# Patient Record
Sex: Female | Born: 1955 | Race: White | Hispanic: No | Smoking: Never smoker
Health system: Southern US, Community
[De-identification: ages and names within clinical notes are randomized; demographics above are authoritative.]

## PROBLEM LIST (undated history)

## (undated) DIAGNOSIS — M5030 Other cervical disc degeneration, unspecified cervical region: Secondary | ICD-10-CM

## (undated) HISTORY — PX: TUBAL LIGATION: SHX77

## (undated) HISTORY — PX: LUMBAR SPINE SURGERY: SHX701

## (undated) HISTORY — DX: Other cervical disc degeneration, unspecified cervical region: M50.30

---

## 2012-10-31 HISTORY — PX: COLONOSCOPY: SHX174

## 2013-07-25 ENCOUNTER — Encounter: Payer: Self-pay | Admitting: Internal Medicine

## 2017-12-05 ENCOUNTER — Encounter: Payer: Self-pay | Admitting: Internal Medicine

## 2017-12-18 DIAGNOSIS — M5416 Radiculopathy, lumbar region: Secondary | ICD-10-CM | POA: Insufficient documentation

## 2018-01-24 ENCOUNTER — Encounter: Payer: Self-pay | Admitting: Nurse Practitioner

## 2018-01-24 ENCOUNTER — Ambulatory Visit: Payer: Medicare HMO | Admitting: Nurse Practitioner

## 2018-01-24 DIAGNOSIS — R197 Diarrhea, unspecified: Secondary | ICD-10-CM

## 2018-01-24 DIAGNOSIS — K559 Vascular disorder of intestine, unspecified: Secondary | ICD-10-CM

## 2018-01-24 DIAGNOSIS — K649 Unspecified hemorrhoids: Secondary | ICD-10-CM

## 2018-01-24 NOTE — Patient Instructions (Signed)
1. Use Preparation H as needed for your hemorrhoid symptoms.  If this does not work well enough for you, call our office and we can send in a prescription. 2. Avoid dairy products.  As we discussed, there are multiple dairy substitute available.  This includes soy milk, almond milk, and coconut based products.  There are even coconut, soy, almond based ice cream and dessert products. 3. Avoid all NSAIDs (ibuprofen, Motrin, Advil, Aleve, naproxen, Naprosyn, anything with "NSAID" on the bottle). 4. Call us if you have any worsening abdominal pain or sudden onset of bleeding. 5. Follow-up in 2 months. 6. Call us if you have any questions or concerns.   It was good to meet you today. Enjoy the sunshine!!!     At [facility] we value your feedback. You may receive a survey about your visit today. Please share your experience as we strive to create trusing relationships with our patients to provide genuine, compassionate, quality care.

## 2018-01-24 NOTE — Progress Notes (Signed)
Primary Care Physician:  Ignatius Specking, MD Primary Gastroenterologist:  Dr. Jena Gauss  Chief Complaint  Patient presents with  . Hemorrhoids    bleeds when has diarrhea or uses wipes  . Diarrhea    occurs with dairy products    HPI:   Rhonda Cox is a 62 y.o. female who presents on referral from primary care for hemorrhoids.  Documentation included with referral was reviewed including office note dated 11/27/2017.  In the assessment and plan noted GERD started on Protonix 40 mg daily.  No mention of hemorrhoids in the office note.  No history of colonoscopy in our system.  Today she states she previously saw Dr. Teena Dunk previously (before he left) and was diagnoses with ischemic colitis based on colonoscopy 2014 with biopsies (patient has provided records). Previously recommended to avoid all NSAIDs. Recommended repeat colonoscopy in 10 years. Currently having an issue with a hemorrhoid. This causes bleeding with diarrhea or wipes (with chemical moisturizer). Has bleeding about once a month and lasts for about one episode. Has obtained preparation H to try with her next episode. Has diarrhea with any dairy products, which she tries to avoid. Currently no abdominal pain, but does intermittently have lower abdominal cramping, intermittent. Denies unintentional weight loss, worsening abdominal pain after eating. Denies melena, N/V, fever, chills. Denies chest pain, dyspnea, dizziness, lightheadedness, syncope, near syncope. Denies any other upper or lower GI symptoms.  HISTORY: Patient provided previous ([facility], [place]) GI records. These were reviewed and marked for scanning. Last TCS dated 07/25/2013 which found ischemic colitis involving the descending colon and sigmoid colon status post biopsy.  Retroflexed views showed rectum without abnormality.  Surgical pathology found the biopsies to be acute colitis consistent with ischemic colitis.  Recommended antibiotic treatment, avoid NSAIDs and  aspirin.  Most recent GI note dated 03/26/2015 for blood in stool.  Denied NSAID use at that time.  Rectal bleeding deemed likely due to hemorrhoids.  Use Preparation H as needed.  Past Medical History:  Diagnosis Date  . DDD (degenerative disc disease), cervical     Past Surgical History:  Procedure Laterality Date  . LUMBAR SPINE SURGERY     x 6  . TUBAL LIGATION      Current Outpatient Medications  Medication Sig Dispense Refill  . acetaminophen (TYLENOL) 500 MG tablet Take 500 mg by mouth as needed.    Marland Kitchen lisinopril (PRINIVIL,ZESTRIL) 2.5 MG tablet Take 1 tablet by mouth daily. Takes with 5mg  tablet to equal 7.5mg     . lisinopril (PRINIVIL,ZESTRIL) 5 MG tablet Take 1 tablet by mouth daily. Takes with 2.5mg  tablet to equal 7.5mg     . pantoprazole (PROTONIX) 40 MG tablet Take 1 tablet by mouth daily.    . traMADol (ULTRAM) 50 MG tablet Take 50 mg by mouth as needed.     No current facility-administered medications for this visit.     Allergies as of 01/24/2018 - Review Complete 01/24/2018  Allergen Reaction Noted  . Ciprofloxacin Nausea And Vomiting 01/24/2018  . Hydrocodone Diarrhea 01/24/2018  . Nsaids  01/24/2018    Family History  Problem Relation Age of Onset  . Colon cancer Neg Hx     Social History   Socioeconomic History  . Marital status: Widowed    Spouse name: Not on file  . Number of children: Not on file  . Years of education: Not on file  . Highest education level: Not on file  Occupational History  . Not on file  Social  Needs  . Financial resource strain: Not on file  . Food insecurity:    Worry: Not on file    Inability: Not on file  . Transportation needs:    Medical: Not on file    Non-medical: Not on file  Tobacco Use  . Smoking status: Never Smoker  . Smokeless tobacco: Never Used  Substance and Sexual Activity  . Alcohol use: Never    Frequency: Never  . Drug use: Never  . Sexual activity: Not on file  Lifestyle  . Physical  activity:    Days per week: Not on file    Minutes per session: Not on file  . Stress: Not on file  Relationships  . Social connections:    Talks on phone: Not on file    Gets together: Not on file    Attends religious service: Not on file    Active member of club or organization: Not on file    Attends meetings of clubs or organizations: Not on file    Relationship status: Not on file  . Intimate partner violence:    Fear of current or ex partner: Not on file    Emotionally abused: Not on file    Physically abused: Not on file    Forced sexual activity: Not on file  Other Topics Concern  . Not on file  Social History Narrative  . Not on file    Review of Systems: General: Negative for anorexia, weight loss, fever, chills, fatigue, weakness. ENT: Negative for hoarseness, difficulty swallowing. CV: Negative for chest pain, angina, palpitations, peripheral edema.  Respiratory: Negative for dyspnea at rest, cough, sputum, wheezing.  GI: See history of present illness. MS: Negative for joint pain, low back pain.  Derm: Negative for rash or itching.  Endo: Negative for unusual weight change.  Heme: Negative for bruising or bleeding. Allergy: Negative for rash or hives.    Physical Exam: BP (!) 154/85   Pulse 76   Temp 98 F (36.7 C) (Oral)   Ht 5\' 6"  (1.676 m)   Wt 143 lb 12.8 oz (65.2 kg)   BMI 23.21 kg/m  General:   Alert and oriented. Pleasant and cooperative. Well-nourished and well-developed.  Head:  Normocephalic and atraumatic. Eyes:  Without icterus, sclera clear and conjunctiva pink.  Ears:  Normal auditory acuity. Cardiovascular:  S1, S2 present without murmurs appreciated. Extremities without clubbing or edema. Respiratory:  Clear to auscultation bilaterally. No wheezes, rales, or rhonchi. No distress.  Gastrointestinal:  +BS, soft, non-tender and non-distended. No HSM noted. No guarding or rebound. No masses appreciated.  Rectal:  Deferred   Musculoskalatal:  Symmetrical without gross deformities. Neurologic:  Alert and oriented x4;  grossly normal neurologically. Psych:  Alert and cooperative. Normal mood and affect. Heme/Lymph/Immune: No excessive bruising noted.    01/24/2018 3:37 PM   Disclaimer: This note was dictated with voice recognition software. Similar sounding words can inadvertently be transcribed and may not be corrected upon review.

## 2018-01-25 ENCOUNTER — Encounter: Payer: Self-pay | Admitting: Internal Medicine

## 2018-01-26 DIAGNOSIS — R197 Diarrhea, unspecified: Secondary | ICD-10-CM | POA: Insufficient documentation

## 2018-01-26 DIAGNOSIS — K649 Unspecified hemorrhoids: Secondary | ICD-10-CM | POA: Insufficient documentation

## 2018-01-26 DIAGNOSIS — K559 Vascular disorder of intestine, unspecified: Secondary | ICD-10-CM | POA: Insufficient documentation

## 2018-01-26 NOTE — Assessment & Plan Note (Signed)
Chronic history of hemorrhoids which causes scant toilet tissue hematochezia typically when she has significant diarrhea or if she uses wet wipes.  Only has one episode, occurs about once a months.  Preparation H was obtained per previous recommendations but she has not tried it yet.  Has diarrhea with any dairy products which she tries to avoid.  No other red flag/warning signs or symptoms.  Recommend she try Preparation H and see if this helps.  Call us if it does not we can try Anusol rectal cream.  Avoid dairy and NSAIDs.  Follow-up in 2 months.  At that time we can decide if we need to repeat colonoscopy based on symptom progression.

## 2018-01-26 NOTE — Assessment & Plan Note (Signed)
Intermittent diarrhea noted with specific food triggers.  Specifically, dairy causes diarrhea.  She tries to avoid this but occasionally does have some dairy.  I discussed the excellent availability of nondairy products that are dairy like.  I have recommended she try these including soy, almond, coconut milk-based products.  Avoid dairy as much as possible.  Follow-up in 2 months.

## 2018-01-26 NOTE — Assessment & Plan Note (Signed)
She was previously diagnosed about 5 years ago with ischemic colitis with an acute GI bleed.  Disease was limited to the descending colon and sigmoid colon.  Previous colonoscopy results scanned.  She was recommended to avoid NSAIDs.  She was treated with antibiotics and her symptoms resolved.  No further acute bleeding.  She does have mild scant toilet tissue hematochezia with excessive diarrhea due to known food triggers.  This occurs about once a month and is about one episode per occurrence.  I recommended dairy avoidance and Preparation H as per above.  Call if Anusol needed.  Avoid NSAIDs.  Follow-up in 2 months we can consider need to repeat colonoscopy.

## 2018-01-29 NOTE — Progress Notes (Signed)
CC'D TO PCP °

## 2018-03-30 ENCOUNTER — Ambulatory Visit: Payer: Medicare HMO | Admitting: Nurse Practitioner

## 2018-03-30 ENCOUNTER — Encounter: Payer: Self-pay | Admitting: Nurse Practitioner

## 2018-03-30 VITALS — BP 189/70 | HR 70 | Temp 97.8°F | Ht 66.0 in | Wt 144.6 lb

## 2018-03-30 DIAGNOSIS — R197 Diarrhea, unspecified: Secondary | ICD-10-CM | POA: Diagnosis not present

## 2018-03-30 DIAGNOSIS — K559 Vascular disorder of intestine, unspecified: Secondary | ICD-10-CM

## 2018-03-30 DIAGNOSIS — K649 Unspecified hemorrhoids: Secondary | ICD-10-CM

## 2018-03-30 NOTE — Assessment & Plan Note (Signed)
Hemorrhoid symptoms improved on Preparation H.  No further rectal bleeding.  Continue Preparation H.  She can call us if this loses effectiveness and we can send in Anusol rectal cream to her pharmacy.  Follow-up in 6 months.

## 2018-03-30 NOTE — Assessment & Plan Note (Signed)
The patient has on and off diarrhea.  This typically triggered with dairy products and occurs with abdominal cramping, gas, bloating.  She has been trying to avoid dairy.  She has purchased Lactaid pills but she has not tried them yet.  We discussed multiple nondairy options to substitute for typical dairy products.  Use Lactaid if unable to avoid dairy.  She verbalized understanding.  She can call our office with any questions.  Follow-up in 6 months.

## 2018-03-30 NOTE — Patient Instructions (Addendum)
1. As we discussed, continue to avoid dairy. 2. There are a large number of dairy alternatives available. 3. If you need to eat dairy, take Lactaid before doing so. 4. Continue to use Preparation H. 5. If you need something stronger for your hemorrhoids, call us and we can send in a steroid cream to help. 6. Return for follow-up in 6 months. 7. Call us if you have any questions or concerns.  At [facility] we value your feedback. You may receive a survey about your visit today. Please share your experience as we strive to create trusting relationships with our patients to provide genuine, compassionate, quality care.  It was great to see you today!  Have a wonderful summer, enjoy the cooler weather!!

## 2018-03-30 NOTE — Progress Notes (Signed)
cc'd to pcp 

## 2018-03-30 NOTE — Progress Notes (Signed)
Referring Provider: Ignatius SpeckingVyas, Dhruv B, MD Primary Care Physician:  Ignatius SpeckingVyas, Dhruv B, MD Primary GI:  Dr. Jena Gaussourk  Chief Complaint  Patient presents with  . Diarrhea    "on and off"    HPI:   Rhonda Cox is a 62 y.o. female who presents for follow-up on hemorrhoids and diarrhea. The patient was last seen in our office 01/24/2018 for the same as well as ischemic colitis.  3 of GERD on Protonix 40 mg daily.  No history of colonoscopy in our system.  Patient previously saw Dr. Teena DunkBenson and was diagnosed with ischemic colitis based on colonoscopy in 2014 with biopsies.  Previous recommendation to avoid all NSAIDs, repeat colonoscopy in 10 years.  Noted bleeding with hemorrhoids when she has diarrhea or wipes, typically about once a month and last for only one episode of the time.  Plans to try Preparation H.  Diarrhea with any dairy products which she tries to avoid.  No abdominal pain at that time, but does note intermittent lower abdominal cramping.  No other GI symptoms.  Recommended use Preparation H, call if Anusol needed, avoid all NSAIDs, avoid dairy products to see if this helps her diarrhea.  Follow-up in 2 months to consider possible need for colonoscopy.  Reviewed previous colon[place] dated 07/[place] which found ischemic colitis involving the descending colon and sigmoid colon status post biopsy.  Rectum without abnormality.  Surgical pathology found the biopsies to be acute colitis consistent with ischemic colitis.  Recommended antibiotics, avoid NSAIDs and aspirin.  Today she states her hemorrhoids are improved on Preparation H. Has "on and off" diarrhea, but "more on then off." The other day she had to use Pepto Bismol, which helped. Has not tried Imodium. Diarrhea is "all due to dairy products." Has tried now A2 Milk which is compatible with lactose intolerance which has helped. Has Lactaid pills, hasn't tried them. Is trying to avoid all dairy. Discussed substitution  options. Has had cramping, bloating, gas when she has diarrhea. When she is not having dairy-related diarrhea she does not have abdominal pain. Denies hematochezia, melena, fever, chills, unintentional weight loss. Denies chest pain, dyspnea, dizziness, lightheadedness, syncope, near syncope. Denies any other upper or lower GI symptoms.  Past Medical History:  Diagnosis Date  . DDD (degenerative disc disease), cervical     Past Surgical History:  Procedure Laterality Date  . LUMBAR SPINE SURGERY     x 6  . TUBAL LIGATION      Current Outpatient Medications  Medication Sig Dispense Refill  . acetaminophen (TYLENOL) 500 MG tablet Take 500 mg by mouth as needed.    Marland Kitchen. lisinopril (PRINIVIL,ZESTRIL) 2.5 MG tablet Take 1 tablet by mouth daily. Takes with 5mg  tablet to equal 7.5mg     . lisinopril (PRINIVIL,ZESTRIL) 5 MG tablet Take 1 tablet by mouth daily. Takes with 2.5mg  tablet to equal 7.5mg     . pantoprazole (PROTONIX) 40 MG tablet Take 1 tablet by mouth as needed.     . traMADol (ULTRAM) 50 MG tablet Take 50 mg by mouth as needed.     No current facility-administered medications for this visit.     Allergies as of 03/30/2018 - Review Complete 03/30/2018  Allergen Reaction Noted  . Ciprofloxacin Nausea And Vomiting 01/24/2018  . Hydrocodone Diarrhea 01/24/2018  . Nsaids  01/24/2018    Family History  Problem Relation Age of Onset  . Colon cancer Neg Hx     Social History   Socioeconomic  History  . Marital status: Widowed    Spouse name: Not on file  . Number of children: Not on file  . Years of education: Not on file  . Highest education level: Not on file  Occupational History  . Not on file  Social Needs  . Financial resource strain: Not on file  . Food insecurity:    Worry: Not on file    Inability: Not on file  . Transportation needs:    Medical: Not on file    Non-medical: Not on file  Tobacco Use  . Smoking status: Never Smoker  . Smokeless tobacco: Never  Used  Substance and Sexual Activity  . Alcohol use: Never    Frequency: Never  . Drug use: Never  . Sexual activity: Not on file  Lifestyle  . Physical activity:    Days per week: Not on file    Minutes per session: Not on file  . Stress: Not on file  Relationships  . Social connections:    Talks on phone: Not on file    Gets together: Not on file    Attends religious service: Not on file    Active member of club or organization: Not on file    Attends meetings of clubs or organizations: Not on file    Relationship status: Not on file  Other Topics Concern  . Not on file  Social History Narrative  . Not on file    Review of Systems: General: Negative for anorexia, weight loss, fever, chills, fatigue, weakness. ENT: Negative for hoarseness, difficulty swallowing. CV: Negative for chest pain, angina, palpitations, peripheral edema.  Respiratory: Negative for dyspnea at rest, cough, sputum, wheezing.  GI: See history of present illness. Endo: Negative for unusual weight change.  Heme: Negative for bruising or bleeding.   Physical Exam: BP (!) 189/70   Pulse 70   Temp 97.8 F (36.6 C) (Oral)   Ht 5\' 6"  (1.676 m)   Wt 144 lb 9.6 oz (65.6 kg)   BMI 23.34 kg/m  General:   Alert and oriented. Pleasant and cooperative. Well-nourished and well-developed.  Eyes:  Without icterus, sclera clear and conjunctiva pink.  Ears:  Normal auditory acuity. Cardiovascular:  S1, S2 present without murmurs appreciated. Extremities without clubbing or edema. Respiratory:  Clear to auscultation bilaterally. No wheezes, rales, or rhonchi. No distress.  Gastrointestinal:  +BS, soft, non-tender and non-distended. No HSM noted. No guarding or rebound. No masses appreciated.  Rectal:  Deferred  Musculoskalatal:  Symmetrical without gross deformities. Neurologic:  Alert and oriented x4;  grossly normal neurologically. Psych:  Alert and cooperative. Normal mood and affect.    03/30/2018 10:53  AM   Disclaimer: This note was dictated with voice recognition software. Similar sounding words can inadvertently be transcribed and may not be corrected upon review.

## 2018-03-30 NOTE — Assessment & Plan Note (Addendum)
Previously diagnosed with ischemic colitis based on CT pathology.  She is avoiding NSAIDs.  No weight loss, worsening diarrhea, worsening abdominal pain.  Her abdominal pain and diarrhea only occur with lactose consumption, as per below.  I have emphasized to her to call us and notify us of any worsening abdominal pain, diarrhea, weight loss.  She verbalized understanding.  Follow-up in 6 months.  At this point, I do not feel there is a need for urgent repeat of her colonoscopy previously completed in 2014.  She indicates she is wanting to avoid colonoscopy if possible.

## 2018-04-10 DIAGNOSIS — Z1339 Encounter for screening examination for other mental health and behavioral disorders: Secondary | ICD-10-CM | POA: Diagnosis not present

## 2018-04-10 DIAGNOSIS — I10 Essential (primary) hypertension: Secondary | ICD-10-CM | POA: Diagnosis not present

## 2018-04-10 DIAGNOSIS — Z6823 Body mass index (BMI) 23.0-23.9, adult: Secondary | ICD-10-CM | POA: Diagnosis not present

## 2018-04-10 DIAGNOSIS — R5383 Other fatigue: Secondary | ICD-10-CM | POA: Diagnosis not present

## 2018-04-10 DIAGNOSIS — Z1211 Encounter for screening for malignant neoplasm of colon: Secondary | ICD-10-CM | POA: Diagnosis not present

## 2018-04-10 DIAGNOSIS — Z7189 Other specified counseling: Secondary | ICD-10-CM | POA: Diagnosis not present

## 2018-04-10 DIAGNOSIS — E7800 Pure hypercholesterolemia, unspecified: Secondary | ICD-10-CM | POA: Diagnosis not present

## 2018-04-10 DIAGNOSIS — Z299 Encounter for prophylactic measures, unspecified: Secondary | ICD-10-CM | POA: Diagnosis not present

## 2018-04-10 DIAGNOSIS — Z1331 Encounter for screening for depression: Secondary | ICD-10-CM | POA: Diagnosis not present

## 2018-04-10 DIAGNOSIS — Z0000 Encounter for general adult medical examination without abnormal findings: Secondary | ICD-10-CM | POA: Diagnosis not present

## 2018-04-10 DIAGNOSIS — Z79899 Other long term (current) drug therapy: Secondary | ICD-10-CM | POA: Diagnosis not present

## 2018-05-08 DIAGNOSIS — M8580 Other specified disorders of bone density and structure, unspecified site: Secondary | ICD-10-CM | POA: Diagnosis not present

## 2018-05-11 DIAGNOSIS — Z6823 Body mass index (BMI) 23.0-23.9, adult: Secondary | ICD-10-CM | POA: Diagnosis not present

## 2018-05-11 DIAGNOSIS — M47816 Spondylosis without myelopathy or radiculopathy, lumbar region: Secondary | ICD-10-CM | POA: Diagnosis not present

## 2018-06-08 DIAGNOSIS — R58 Hemorrhage, not elsewhere classified: Secondary | ICD-10-CM | POA: Diagnosis not present

## 2018-06-08 DIAGNOSIS — Z6823 Body mass index (BMI) 23.0-23.9, adult: Secondary | ICD-10-CM | POA: Diagnosis not present

## 2018-06-08 DIAGNOSIS — I10 Essential (primary) hypertension: Secondary | ICD-10-CM | POA: Diagnosis not present

## 2018-06-08 DIAGNOSIS — Z299 Encounter for prophylactic measures, unspecified: Secondary | ICD-10-CM | POA: Diagnosis not present

## 2018-06-11 DIAGNOSIS — Z6823 Body mass index (BMI) 23.0-23.9, adult: Secondary | ICD-10-CM | POA: Diagnosis not present

## 2018-06-11 DIAGNOSIS — Z01419 Encounter for gynecological examination (general) (routine) without abnormal findings: Secondary | ICD-10-CM | POA: Diagnosis not present

## 2018-06-21 DIAGNOSIS — M47816 Spondylosis without myelopathy or radiculopathy, lumbar region: Secondary | ICD-10-CM | POA: Diagnosis not present

## 2018-06-21 DIAGNOSIS — Z6823 Body mass index (BMI) 23.0-23.9, adult: Secondary | ICD-10-CM | POA: Diagnosis not present

## 2018-06-21 DIAGNOSIS — M4317 Spondylolisthesis, lumbosacral region: Secondary | ICD-10-CM | POA: Diagnosis not present

## 2018-08-09 DIAGNOSIS — M47816 Spondylosis without myelopathy or radiculopathy, lumbar region: Secondary | ICD-10-CM | POA: Diagnosis not present

## 2018-08-09 DIAGNOSIS — Z01818 Encounter for other preprocedural examination: Secondary | ICD-10-CM | POA: Diagnosis not present

## 2018-08-09 DIAGNOSIS — I10 Essential (primary) hypertension: Secondary | ICD-10-CM | POA: Diagnosis not present

## 2018-08-09 DIAGNOSIS — Z79899 Other long term (current) drug therapy: Secondary | ICD-10-CM | POA: Diagnosis not present

## 2018-08-13 DIAGNOSIS — M4326 Fusion of spine, lumbar region: Secondary | ICD-10-CM | POA: Diagnosis not present

## 2018-08-13 DIAGNOSIS — M5387 Other specified dorsopathies, lumbosacral region: Secondary | ICD-10-CM | POA: Diagnosis not present

## 2018-08-13 DIAGNOSIS — M4726 Other spondylosis with radiculopathy, lumbar region: Secondary | ICD-10-CM | POA: Diagnosis not present

## 2018-08-13 DIAGNOSIS — Z79899 Other long term (current) drug therapy: Secondary | ICD-10-CM | POA: Diagnosis not present

## 2018-08-13 DIAGNOSIS — I10 Essential (primary) hypertension: Secondary | ICD-10-CM | POA: Diagnosis not present

## 2018-08-13 DIAGNOSIS — M4727 Other spondylosis with radiculopathy, lumbosacral region: Secondary | ICD-10-CM | POA: Diagnosis not present

## 2018-08-13 DIAGNOSIS — M5186 Other intervertebral disc disorders, lumbar region: Secondary | ICD-10-CM | POA: Diagnosis not present

## 2018-08-13 DIAGNOSIS — M47817 Spondylosis without myelopathy or radiculopathy, lumbosacral region: Secondary | ICD-10-CM | POA: Diagnosis not present

## 2018-08-13 DIAGNOSIS — Z981 Arthrodesis status: Secondary | ICD-10-CM | POA: Diagnosis not present

## 2018-08-21 DIAGNOSIS — M47816 Spondylosis without myelopathy or radiculopathy, lumbar region: Secondary | ICD-10-CM | POA: Diagnosis not present

## 2018-08-21 DIAGNOSIS — Z981 Arthrodesis status: Secondary | ICD-10-CM | POA: Diagnosis not present

## 2018-08-21 DIAGNOSIS — M4317 Spondylolisthesis, lumbosacral region: Secondary | ICD-10-CM | POA: Diagnosis not present

## 2018-08-21 DIAGNOSIS — M4327 Fusion of spine, lumbosacral region: Secondary | ICD-10-CM | POA: Diagnosis not present

## 2018-08-23 DIAGNOSIS — E7800 Pure hypercholesterolemia, unspecified: Secondary | ICD-10-CM | POA: Diagnosis not present

## 2018-08-23 DIAGNOSIS — Z299 Encounter for prophylactic measures, unspecified: Secondary | ICD-10-CM | POA: Diagnosis not present

## 2018-08-23 DIAGNOSIS — M545 Low back pain: Secondary | ICD-10-CM | POA: Diagnosis not present

## 2018-08-23 DIAGNOSIS — Z6823 Body mass index (BMI) 23.0-23.9, adult: Secondary | ICD-10-CM | POA: Diagnosis not present

## 2018-08-23 DIAGNOSIS — I10 Essential (primary) hypertension: Secondary | ICD-10-CM | POA: Diagnosis not present

## 2018-08-23 DIAGNOSIS — Z09 Encounter for follow-up examination after completed treatment for conditions other than malignant neoplasm: Secondary | ICD-10-CM | POA: Diagnosis not present

## 2018-10-03 ENCOUNTER — Ambulatory Visit: Payer: Medicare HMO | Admitting: Nurse Practitioner

## 2018-10-03 ENCOUNTER — Encounter: Payer: Self-pay | Admitting: Nurse Practitioner

## 2018-10-03 VITALS — BP 152/78 | HR 73 | Temp 97.4°F | Ht 66.0 in | Wt 144.0 lb

## 2018-10-03 DIAGNOSIS — K219 Gastro-esophageal reflux disease without esophagitis: Secondary | ICD-10-CM

## 2018-10-03 DIAGNOSIS — R197 Diarrhea, unspecified: Secondary | ICD-10-CM

## 2018-10-03 DIAGNOSIS — K559 Vascular disorder of intestine, unspecified: Secondary | ICD-10-CM | POA: Diagnosis not present

## 2018-10-03 NOTE — Assessment & Plan Note (Signed)
Diarrhea with likely lactose intolerance.  She has not had any diarrhea since eliminating dairy from her diet completely.  She is found tolerable substitutes that she enjoys including lactose-free ice cream, lactose-free milk substitute.  Recommend she continue with lifestyle changes and dairy elimination.  Follow-up in 1 year.  Follow-up sooner for any new problems or worsening symptoms.

## 2018-10-03 NOTE — Assessment & Plan Note (Signed)
Diagnosed with chronic ischemic colitis on colonoscopy in 2014 by Dr. Teena DunkBenson.  She has not had any symptoms since we have been seeing her.  Continue to monitor for symptoms or acute changes.  Otherwise, follow-up in 1 year.

## 2018-10-03 NOTE — Progress Notes (Signed)
Referring Provider: Ignatius SpeckingVyas, Dhruv B, MD Primary Care Physician:  Ignatius SpeckingVyas, Dhruv B, MD Primary GI:  Dr. Jena Gaussourk  Chief Complaint  Patient presents with  . colitis    doing ok    HPI:   Rhonda Cox is a 62 y.o. female who presents for follow-up on colitis, diarrhea, hemorrhoids.  The patient was last seen in our office 03/30/2018 for hemorrhoids, diarrhea, ischemic colitis.  Noted history of GERD on Protonix.  Diagnosed with ischemic colitis on colonoscopy in 2014 with Dr. Teena DunkBenson based on biopsies.  Recommended avoid all NSAIDs, repeat colonoscopy in 10 years.  Chronic diarrhea with hemorrhoids and rectal bleeding about once a month.  Avoids dairy as a trigger.  Intermittent lower abdominal cramping pain.  At her last visit her hemorrhoids were improved with Preparation H and has "on and off" diarrhea but "more on than off."  Pepto-Bismol has helped, had not tried Imodium and noted diarrhea is "all due to dairy products."  Lactose-free milk substitute is helped, has Lactaid pills but has not tried them.  With her diarrhea triggered by dairy she does have bloating, cramping, gas.  No abdominal pain unless she consumes dairy.  Overall felt abdominal symptoms likely due to lactose intolerance.  Recommended continue avoidance of dairy, continue Preparation H as needed, follow-up in 6 months.  Today she states she's doing well overall. GERD is doing well on PPI, well controlled with trigger avoidance. Diarrhea is doing great with dairy avoidance. She drinks A2 milk that is lactose free. If she eats ice cream, she eats [place] lactose free ice cream. She had back surgery recently and during her pre- and post-surgery and was treated with Bactrim; this caused significant N/V and it has been added to her allergy list. Denies abdominal pain, N/V, fever, chills, unintentional weight loss.   Past Medical History:  Diagnosis Date  . DDD (degenerative disc disease), cervical     Past Surgical History:  Procedure  Laterality Date  . LUMBAR SPINE SURGERY     x 6  . TUBAL LIGATION      Current Outpatient Medications  Medication Sig Dispense Refill  . acetaminophen (TYLENOL) 500 MG tablet Take 500 mg by mouth as needed.    . Calcium Carb-Cholecalciferol (CALCIUM 1000 + D) 1000-800 MG-UNIT TABS Take 1 tablet by mouth daily.     Marland Kitchen. lisinopril (PRINIVIL,ZESTRIL) 10 MG tablet Take 10 mg by mouth daily.  4  . pantoprazole (PROTONIX) 40 MG tablet Take 1 tablet by mouth as needed.     . traMADol (ULTRAM) 50 MG tablet Take 50 mg by mouth as needed.     No current facility-administered medications for this visit.     Allergies as of 10/03/2018 - Review Complete 10/03/2018  Allergen Reaction Noted  . Bactrim [sulfamethoxazole-trimethoprim] Nausea And Vomiting 10/03/2018  . Ciprofloxacin Nausea And Vomiting 01/24/2018  . Hydrocodone Diarrhea 01/24/2018  . Nsaids  01/24/2018    Family History  Problem Relation Age of Onset  . Colon cancer Neg Hx     Social History   Socioeconomic History  . Marital status: Widowed    Spouse name: Not on file  . Number of children: Not on file  . Years of education: Not on file  . Highest education level: Not on file  Occupational History  . Not on file  Social Needs  . Financial resource strain: Not on file  . Food insecurity:    Worry: Not on file    Inability: Not  on file  . Transportation needs:    Medical: Not on file    Non-medical: Not on file  Tobacco Use  . Smoking status: Never Smoker  . Smokeless tobacco: Never Used  Substance and Sexual Activity  . Alcohol use: Never    Frequency: Never  . Drug use: Never  . Sexual activity: Not on file  Lifestyle  . Physical activity:    Days per week: Not on file    Minutes per session: Not on file  . Stress: Not on file  Relationships  . Social connections:    Talks on phone: Not on file    Gets together: Not on file    Attends religious service: Not on file    Active member of club or  organization: Not on file    Attends meetings of clubs or organizations: Not on file    Relationship status: Not on file  Other Topics Concern  . Not on file  Social History Narrative  . Not on file    Review of Systems: General: Negative for anorexia, weight loss, fever, chills, fatigue, weakness. Eyes: Negative for vision changes.  ENT: Negative for hoarseness, difficulty swallowing , nasal congestion. CV: Negative for chest pain, angina, palpitations, dyspnea on exertion, peripheral edema.  Respiratory: Negative for dyspnea at rest, dyspnea on exertion, cough, sputum, wheezing.  GI: See history of present illness. GU:  Negative for dysuria, hematuria, urinary incontinence, urinary frequency, nocturnal urination.  MS: Negative for joint pain, low back pain.  Derm: Negative for rash or itching.  Neuro: Negative for weakness, abnormal sensation, seizure, frequent headaches, memory loss, confusion.  Psych: Negative for anxiety, depression, suicidal ideation, hallucinations.  Endo: Negative for unusual weight change.  Heme: Negative for bruising or bleeding. Allergy: Negative for rash or hives.   Physical Exam: BP (!) 152/78   Pulse 73   Temp (!) 97.4 F (36.3 C) (Oral)   Ht 5\' 6"  (1.676 m)   Wt 144 lb (65.3 kg)   BMI 23.24 kg/m  General:   Alert and oriented. Pleasant and cooperative. Well-nourished and well-developed.  Head:  Normocephalic and atraumatic. Eyes:  Without icterus, sclera clear and conjunctiva pink.  Ears:  Normal auditory acuity. Mouth:  No deformity or lesions, oral mucosa pink.  Throat/Neck:  Supple, without mass or thyromegaly. Cardiovascular:  S1, S2 present without murmurs appreciated. Normal pulses noted. Extremities without clubbing or edema. Respiratory:  Clear to auscultation bilaterally. No wheezes, rales, or rhonchi. No distress.  Gastrointestinal:  +BS, soft, non-tender and non-distended. No HSM noted. No guarding or rebound. No masses  appreciated.  Rectal:  Deferred  Musculoskalatal:  Symmetrical without gross deformities. Normal posture. Skin:  Intact without significant lesions or rashes. Neurologic:  Alert and oriented x4;  grossly normal neurologically. Psych:  Alert and cooperative. Normal mood and affect. Heme/Lymph/Immune: No significant cervical adenopathy. No excessive bruising noted.    10/03/2018 10:35 AM   Disclaimer: This note was dictated with voice recognition software. Similar sounding words can inadvertently be transcribed and may not be corrected upon review.

## 2018-10-03 NOTE — Patient Instructions (Signed)
1. Continue your current medications. 2. Return for follow-up in 1 year. 3. Call us if you have any questions or concerns.  At [facility] we value your feedback. You may receive a survey about your visit today. Please share your experience as we strive to create trusting relationships with our patients to provide genuine, compassionate, quality care.  We appreciate your understanding and patience as we review any laboratory studies, imaging, and other diagnostic tests that are ordered as we care for you. Our office policy is 5 business days for review of these results, and any emergent or urgent results are addressed in a timely manner for your best interest. If you do not hear from our office in 1 week, please contact us.   We also encourage the use of MyChart, which contains your medical information for your review as well. If you are not enrolled in this feature, an access code is on this after visit summary for your convenience. Thank you for allowing us to be involved in your care.  It was great to see you today!  I hope you have a Merry Christmas!!

## 2018-10-03 NOTE — Assessment & Plan Note (Signed)
History of chronic GERD.  Currently asymptomatic on PPI.  Recommend she continue her medications and lifestyle changes.  Follow-up in 1 year.

## 2018-10-03 NOTE — Progress Notes (Signed)
CC'D TO PCP °

## 2018-10-11 DIAGNOSIS — Z6823 Body mass index (BMI) 23.0-23.9, adult: Secondary | ICD-10-CM | POA: Diagnosis not present

## 2018-10-11 DIAGNOSIS — Z299 Encounter for prophylactic measures, unspecified: Secondary | ICD-10-CM | POA: Diagnosis not present

## 2018-10-11 DIAGNOSIS — E7800 Pure hypercholesterolemia, unspecified: Secondary | ICD-10-CM | POA: Diagnosis not present

## 2018-10-11 DIAGNOSIS — I10 Essential (primary) hypertension: Secondary | ICD-10-CM | POA: Diagnosis not present

## 2018-10-15 DIAGNOSIS — J301 Allergic rhinitis due to pollen: Secondary | ICD-10-CM | POA: Diagnosis not present

## 2018-11-27 DIAGNOSIS — I10 Essential (primary) hypertension: Secondary | ICD-10-CM | POA: Diagnosis not present

## 2018-11-27 DIAGNOSIS — Z299 Encounter for prophylactic measures, unspecified: Secondary | ICD-10-CM | POA: Diagnosis not present

## 2018-11-27 DIAGNOSIS — M79603 Pain in arm, unspecified: Secondary | ICD-10-CM | POA: Diagnosis not present

## 2018-11-27 DIAGNOSIS — Z2821 Immunization not carried out because of patient refusal: Secondary | ICD-10-CM | POA: Diagnosis not present

## 2018-11-27 DIAGNOSIS — Z6823 Body mass index (BMI) 23.0-23.9, adult: Secondary | ICD-10-CM | POA: Diagnosis not present

## 2018-11-27 DIAGNOSIS — Z789 Other specified health status: Secondary | ICD-10-CM | POA: Diagnosis not present

## 2018-11-27 DIAGNOSIS — E7800 Pure hypercholesterolemia, unspecified: Secondary | ICD-10-CM | POA: Diagnosis not present

## 2018-12-12 DIAGNOSIS — Z6823 Body mass index (BMI) 23.0-23.9, adult: Secondary | ICD-10-CM | POA: Diagnosis not present

## 2018-12-12 DIAGNOSIS — M47816 Spondylosis without myelopathy or radiculopathy, lumbar region: Secondary | ICD-10-CM | POA: Diagnosis not present

## 2018-12-12 DIAGNOSIS — M4317 Spondylolisthesis, lumbosacral region: Secondary | ICD-10-CM | POA: Diagnosis not present

## 2018-12-13 DIAGNOSIS — Z6823 Body mass index (BMI) 23.0-23.9, adult: Secondary | ICD-10-CM | POA: Diagnosis not present

## 2018-12-13 DIAGNOSIS — I10 Essential (primary) hypertension: Secondary | ICD-10-CM | POA: Diagnosis not present

## 2018-12-13 DIAGNOSIS — Z789 Other specified health status: Secondary | ICD-10-CM | POA: Diagnosis not present

## 2018-12-13 DIAGNOSIS — Z299 Encounter for prophylactic measures, unspecified: Secondary | ICD-10-CM | POA: Diagnosis not present

## 2018-12-14 DIAGNOSIS — Z299 Encounter for prophylactic measures, unspecified: Secondary | ICD-10-CM | POA: Diagnosis not present

## 2018-12-14 DIAGNOSIS — I10 Essential (primary) hypertension: Secondary | ICD-10-CM | POA: Diagnosis not present

## 2018-12-14 DIAGNOSIS — R197 Diarrhea, unspecified: Secondary | ICD-10-CM | POA: Diagnosis not present

## 2018-12-14 DIAGNOSIS — Z6823 Body mass index (BMI) 23.0-23.9, adult: Secondary | ICD-10-CM | POA: Diagnosis not present

## 2018-12-14 DIAGNOSIS — R109 Unspecified abdominal pain: Secondary | ICD-10-CM | POA: Diagnosis not present

## 2018-12-14 DIAGNOSIS — R509 Fever, unspecified: Secondary | ICD-10-CM | POA: Diagnosis not present

## 2019-01-15 DIAGNOSIS — Z6823 Body mass index (BMI) 23.0-23.9, adult: Secondary | ICD-10-CM | POA: Diagnosis not present

## 2019-01-15 DIAGNOSIS — K559 Vascular disorder of intestine, unspecified: Secondary | ICD-10-CM | POA: Diagnosis not present

## 2019-01-15 DIAGNOSIS — Z299 Encounter for prophylactic measures, unspecified: Secondary | ICD-10-CM | POA: Diagnosis not present

## 2019-01-15 DIAGNOSIS — I10 Essential (primary) hypertension: Secondary | ICD-10-CM | POA: Diagnosis not present

## 2019-01-15 DIAGNOSIS — K589 Irritable bowel syndrome without diarrhea: Secondary | ICD-10-CM | POA: Diagnosis not present

## 2019-01-23 DIAGNOSIS — M4312 Spondylolisthesis, cervical region: Secondary | ICD-10-CM | POA: Diagnosis not present

## 2019-01-23 DIAGNOSIS — M47816 Spondylosis without myelopathy or radiculopathy, lumbar region: Secondary | ICD-10-CM | POA: Diagnosis not present

## 2019-01-23 DIAGNOSIS — Z6823 Body mass index (BMI) 23.0-23.9, adult: Secondary | ICD-10-CM | POA: Diagnosis not present

## 2019-02-14 DIAGNOSIS — Z713 Dietary counseling and surveillance: Secondary | ICD-10-CM | POA: Diagnosis not present

## 2019-02-14 DIAGNOSIS — Z299 Encounter for prophylactic measures, unspecified: Secondary | ICD-10-CM | POA: Diagnosis not present

## 2019-02-14 DIAGNOSIS — Z6823 Body mass index (BMI) 23.0-23.9, adult: Secondary | ICD-10-CM | POA: Diagnosis not present

## 2019-02-14 DIAGNOSIS — I10 Essential (primary) hypertension: Secondary | ICD-10-CM | POA: Diagnosis not present

## 2019-04-12 DIAGNOSIS — Z6824 Body mass index (BMI) 24.0-24.9, adult: Secondary | ICD-10-CM | POA: Diagnosis not present

## 2019-04-12 DIAGNOSIS — Z79899 Other long term (current) drug therapy: Secondary | ICD-10-CM | POA: Diagnosis not present

## 2019-04-12 DIAGNOSIS — Z0000 Encounter for general adult medical examination without abnormal findings: Secondary | ICD-10-CM | POA: Diagnosis not present

## 2019-04-12 DIAGNOSIS — Z7189 Other specified counseling: Secondary | ICD-10-CM | POA: Diagnosis not present

## 2019-04-12 DIAGNOSIS — I10 Essential (primary) hypertension: Secondary | ICD-10-CM | POA: Diagnosis not present

## 2019-04-12 DIAGNOSIS — E7800 Pure hypercholesterolemia, unspecified: Secondary | ICD-10-CM | POA: Diagnosis not present

## 2019-04-12 DIAGNOSIS — Z1331 Encounter for screening for depression: Secondary | ICD-10-CM | POA: Diagnosis not present

## 2019-04-12 DIAGNOSIS — Z1211 Encounter for screening for malignant neoplasm of colon: Secondary | ICD-10-CM | POA: Diagnosis not present

## 2019-04-12 DIAGNOSIS — Z1339 Encounter for screening examination for other mental health and behavioral disorders: Secondary | ICD-10-CM | POA: Diagnosis not present

## 2019-04-12 DIAGNOSIS — Z299 Encounter for prophylactic measures, unspecified: Secondary | ICD-10-CM | POA: Diagnosis not present

## 2019-04-30 DIAGNOSIS — I10 Essential (primary) hypertension: Secondary | ICD-10-CM | POA: Diagnosis not present

## 2019-04-30 DIAGNOSIS — M545 Low back pain: Secondary | ICD-10-CM | POA: Diagnosis not present

## 2019-06-04 ENCOUNTER — Other Ambulatory Visit: Payer: Self-pay | Admitting: Student

## 2019-06-04 ENCOUNTER — Other Ambulatory Visit (HOSPITAL_COMMUNITY): Payer: Self-pay | Admitting: Student

## 2019-06-04 DIAGNOSIS — Z299 Encounter for prophylactic measures, unspecified: Secondary | ICD-10-CM | POA: Diagnosis not present

## 2019-06-04 DIAGNOSIS — Z6824 Body mass index (BMI) 24.0-24.9, adult: Secondary | ICD-10-CM | POA: Diagnosis not present

## 2019-06-04 DIAGNOSIS — K559 Vascular disorder of intestine, unspecified: Secondary | ICD-10-CM | POA: Diagnosis not present

## 2019-06-04 DIAGNOSIS — T18108A Unspecified foreign body in esophagus causing other injury, initial encounter: Secondary | ICD-10-CM

## 2019-06-04 DIAGNOSIS — I10 Essential (primary) hypertension: Secondary | ICD-10-CM | POA: Diagnosis not present

## 2019-06-05 ENCOUNTER — Ambulatory Visit (HOSPITAL_COMMUNITY)
Admission: RE | Admit: 2019-06-05 | Discharge: 2019-06-05 | Disposition: A | Discharge status: Home / Self Care | Payer: Medicare HMO | Source: Ambulatory Visit | Attending: Internal Medicine | Admitting: Internal Medicine

## 2019-06-05 ENCOUNTER — Other Ambulatory Visit: Payer: Self-pay

## 2019-06-05 DIAGNOSIS — K222 Esophageal obstruction: Secondary | ICD-10-CM | POA: Diagnosis not present

## 2019-06-05 DIAGNOSIS — T18108A Unspecified foreign body in esophagus causing other injury, initial encounter: Secondary | ICD-10-CM | POA: Insufficient documentation

## 2019-07-24 DIAGNOSIS — Z299 Encounter for prophylactic measures, unspecified: Secondary | ICD-10-CM | POA: Diagnosis not present

## 2019-07-24 DIAGNOSIS — M5134 Other intervertebral disc degeneration, thoracic region: Secondary | ICD-10-CM | POA: Diagnosis not present

## 2019-07-24 DIAGNOSIS — Z713 Dietary counseling and surveillance: Secondary | ICD-10-CM | POA: Diagnosis not present

## 2019-07-24 DIAGNOSIS — I10 Essential (primary) hypertension: Secondary | ICD-10-CM | POA: Diagnosis not present

## 2019-07-24 DIAGNOSIS — Z6825 Body mass index (BMI) 25.0-25.9, adult: Secondary | ICD-10-CM | POA: Diagnosis not present

## 2019-07-30 DIAGNOSIS — M545 Low back pain: Secondary | ICD-10-CM | POA: Diagnosis not present

## 2019-07-30 DIAGNOSIS — I10 Essential (primary) hypertension: Secondary | ICD-10-CM | POA: Diagnosis not present

## 2019-07-30 DIAGNOSIS — M5416 Radiculopathy, lumbar region: Secondary | ICD-10-CM | POA: Diagnosis not present

## 2019-08-01 ENCOUNTER — Telehealth: Payer: Self-pay

## 2019-08-01 ENCOUNTER — Other Ambulatory Visit: Payer: Self-pay | Admitting: Neurosurgery

## 2019-08-01 DIAGNOSIS — M47816 Spondylosis without myelopathy or radiculopathy, lumbar region: Secondary | ICD-10-CM | POA: Insufficient documentation

## 2019-08-01 DIAGNOSIS — M5126 Other intervertebral disc displacement, lumbar region: Secondary | ICD-10-CM | POA: Insufficient documentation

## 2019-08-01 DIAGNOSIS — M4317 Spondylolisthesis, lumbosacral region: Secondary | ICD-10-CM | POA: Insufficient documentation

## 2019-08-01 DIAGNOSIS — M5416 Radiculopathy, lumbar region: Secondary | ICD-10-CM

## 2019-08-01 NOTE — Telephone Encounter (Signed)
Spoke with patient to review her medications and drug allergies before being scheduled for a myelogram.  She was informed she will be here about two hours, needs a driver (who's not allowed in the building at this time), and will need to be on 24 hours of strict bedrest which was explained to patient.  She also was informed she will need to hold her Tramadol for 48 hours before, and 24 hours after, the myelogram.

## 2019-08-12 ENCOUNTER — Other Ambulatory Visit: Payer: Self-pay

## 2019-08-12 ENCOUNTER — Ambulatory Visit
Admission: RE | Admit: 2019-08-12 | Discharge: 2019-08-12 | Disposition: A | Discharge status: Home / Self Care | Payer: Medicare HMO | Source: Ambulatory Visit | Attending: Neurosurgery | Admitting: Neurosurgery

## 2019-08-12 DIAGNOSIS — M5126 Other intervertebral disc displacement, lumbar region: Secondary | ICD-10-CM | POA: Diagnosis not present

## 2019-08-12 DIAGNOSIS — M5417 Radiculopathy, lumbosacral region: Secondary | ICD-10-CM | POA: Diagnosis not present

## 2019-08-12 DIAGNOSIS — M4326 Fusion of spine, lumbar region: Secondary | ICD-10-CM | POA: Diagnosis not present

## 2019-08-12 DIAGNOSIS — M5416 Radiculopathy, lumbar region: Secondary | ICD-10-CM

## 2019-08-12 DIAGNOSIS — M48061 Spinal stenosis, lumbar region without neurogenic claudication: Secondary | ICD-10-CM | POA: Diagnosis not present

## 2019-08-12 DIAGNOSIS — M5136 Other intervertebral disc degeneration, lumbar region: Secondary | ICD-10-CM | POA: Diagnosis not present

## 2019-08-12 MED ORDER — DIAZEPAM 5 MG PO TABS
5.0000 mg | ORAL_TABLET | Freq: Once | ORAL | Status: AC
  Administered 2019-08-12: 5 mg via ORAL

## 2019-08-12 MED ORDER — IOPAMIDOL (ISOVUE-M 200) INJECTION 41%
20.0000 mL | Freq: Once | INTRAMUSCULAR | Status: AC
  Administered 2019-08-12: 20 mL via INTRATHECAL

## 2019-08-12 NOTE — Discharge Instructions (Signed)
Myelogram Discharge Instructions  1. Go home and rest quietly for the next 24 hours.  It is important to lie flat for the next 24 hours.  Get up only to go to the restroom.  You may lie in the bed or on a couch on your back, your stomach, your left side or your right side.  You may have one pillow under your head.  You may have pillows between your knees while you are on your side or under your knees while you are on your back.  2. DO NOT drive today.  Recline the seat as far back as it will go, while still wearing your seat belt, on the way home.  3. You may get up to go to the bathroom as needed.  You may sit up for 10 minutes to eat.  You may resume your normal diet and medications unless otherwise indicated.  Drink lots of extra fluids today and tomorrow.  4. The incidence of headache, nausea, or vomiting is about 5% (one in 20 patients).  If you develop a headache, lie flat and drink plenty of fluids until the headache goes away.  Caffeinated beverages may be helpful.  If you develop severe nausea and vomiting or a headache that does not go away with flat bed rest, call [phone].  5. You may resume normal activities after your 24 hours of bed rest is over; however, do not exert yourself strongly or do any heavy lifting tomorrow. If when you get up you have a headache when standing, go back to bed and force fluids for another 24 hours.  6. Call your physician for a follow-up appointment.  The results of your myelogram will be sent directly to your physician by the following day.  7. If you have any questions or if complications develop after you arrive home, please call [phone].  Discharge instructions have been explained to the patient.  The patient, or the person responsible for the patient, fully understands these instructions  YOU MAY RESTART YOUR TRAMADOL TOMORROW 08/13/2019 AT 1030AM

## 2019-08-12 NOTE — Progress Notes (Signed)
Pt reports she has not taken her Tramadol in at least 48 hours. Pt also verbalizes understanding to not resume Tramadol until tomorrow 08/13/2019 at 1030.

## 2019-08-30 DIAGNOSIS — I10 Essential (primary) hypertension: Secondary | ICD-10-CM | POA: Diagnosis not present

## 2019-08-30 DIAGNOSIS — M545 Low back pain: Secondary | ICD-10-CM | POA: Diagnosis not present

## 2019-08-31 IMAGING — RF ESOPHAGUS/BARIUM SWALLOW/TABLET STUDY
12 of 13 series · 12 of 24 positions shown · non-contrast
Comparison: None

CLINICAL DATA: Foreign body in esophagus, feels like she has had a
cucumber piece stuck in her throat since [REDACTED]

EXAM:
ESOPHOGRAM / BARIUM SWALLOW / BARIUM TABLET STUDY
TECHNIQUE: Combined double contrast and single contrast examination performed
using effervescent crystals, thick barium liquid, and thin barium
liquid. The patient was observed with fluoroscopy swallowing a 13 mm
barium sulphate tablet.
FLUOROSCOPY TIME:  Fluoroscopy Time:  1 minutes 24 seconds
Radiation Exposure Index (if provided by the fluoroscopic device):
21.8 mGy
Number of Acquired Spot Images: multiple fluoroscopic screen
captures

[Series 1: cp_standard · 0.18mm/px · 1 of 50 frames shown (1 of 12)]
[frame 43/50]
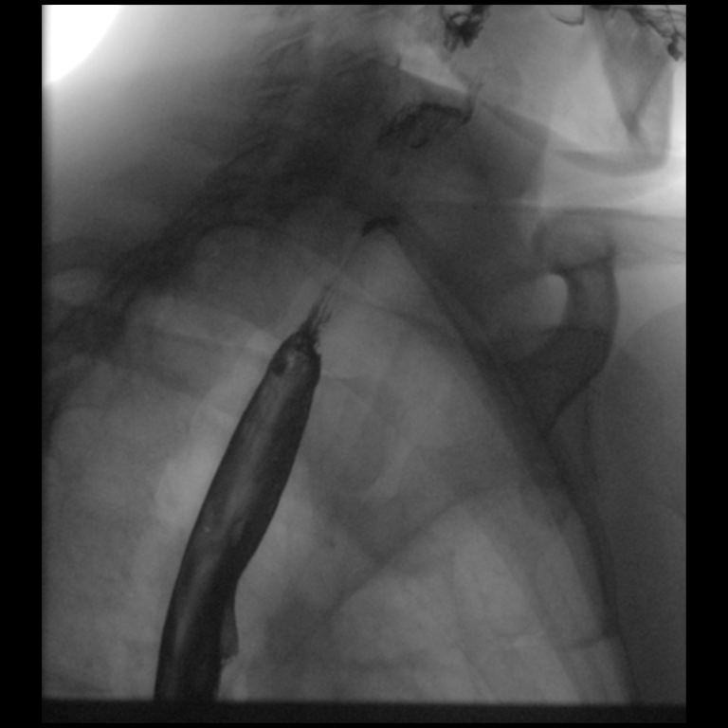

[Series 2: cp_standard · 0.18mm/px · 1 of 32 frames shown (2 of 12)]
[frame 28/32]
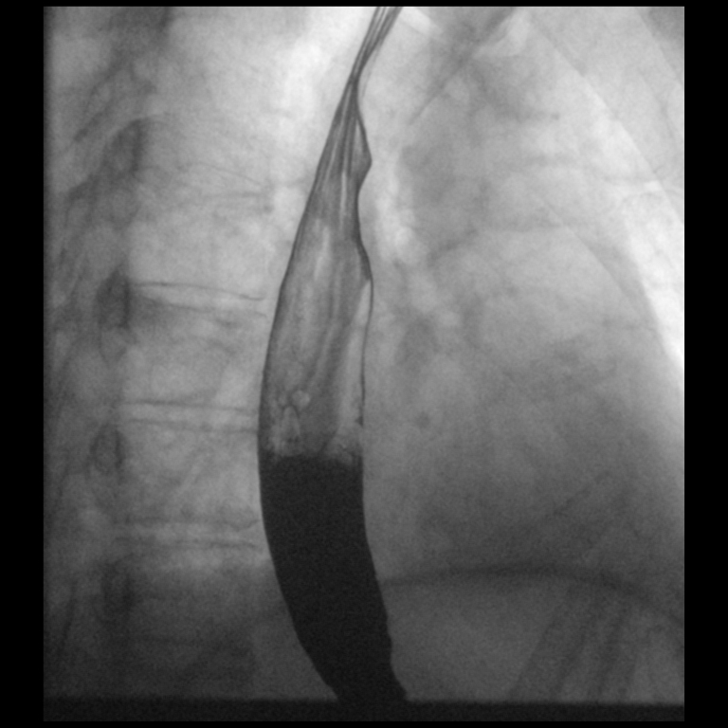

[Series 3: cp_standard · 0.18mm/px · 1 of 157 frames shown (3 of 12)]
[frame 150/157]
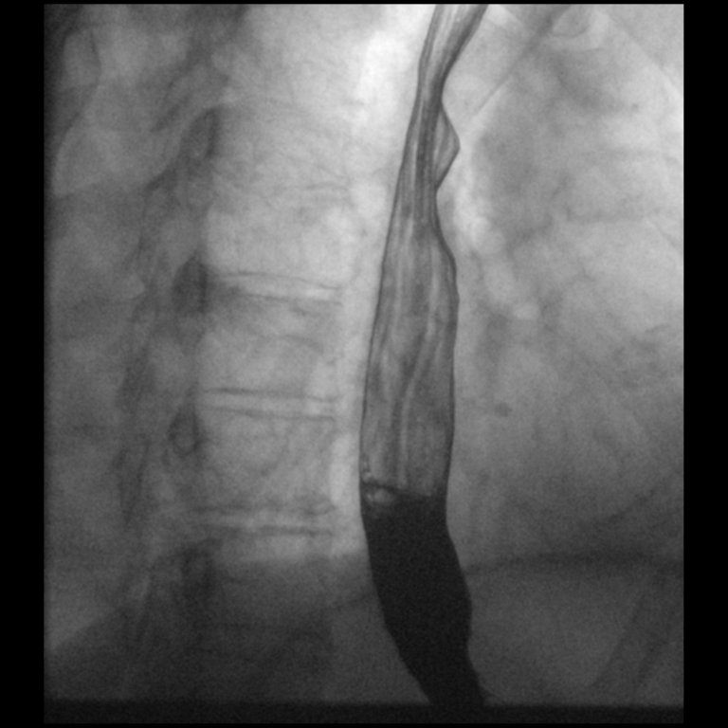

[Series 5: cp_standard · 0.18mm/px · 1 of 62 frames shown (4 of 12)]
[frame 3/62]
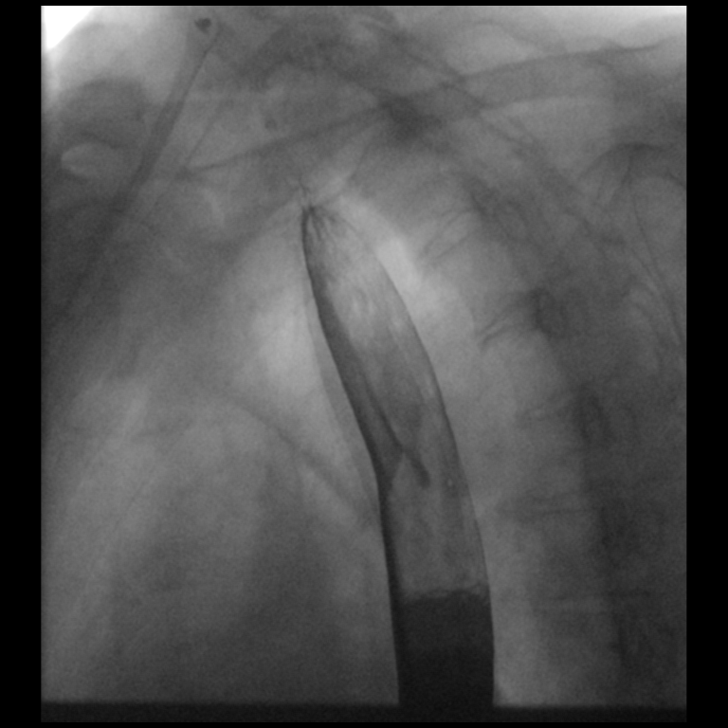

[Series 6: cp_standard · 0.27mm/px · 1 of 244 frames shown (5 of 12)]
[frame 25/244]
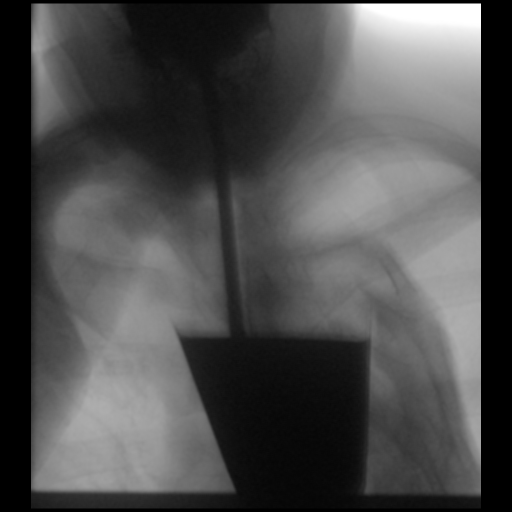

[Series 7: cp_standard · 0.28mm/px · 1 of 131 frames shown (6 of 12)]
[frame 20/131]
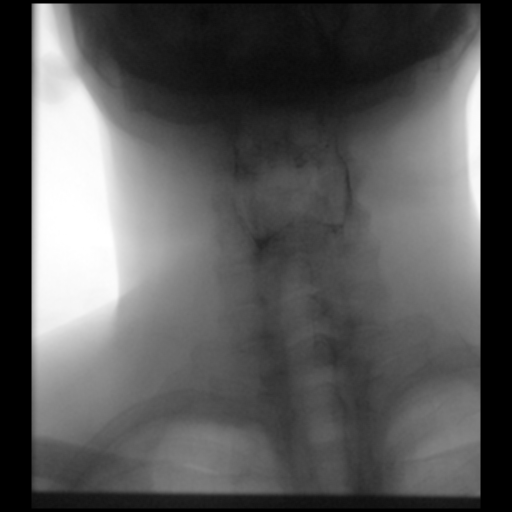

[Series 8: cp_standard · 0.27mm/px · 1 of 96 frames shown (7 of 12)]
[frame 15/96]
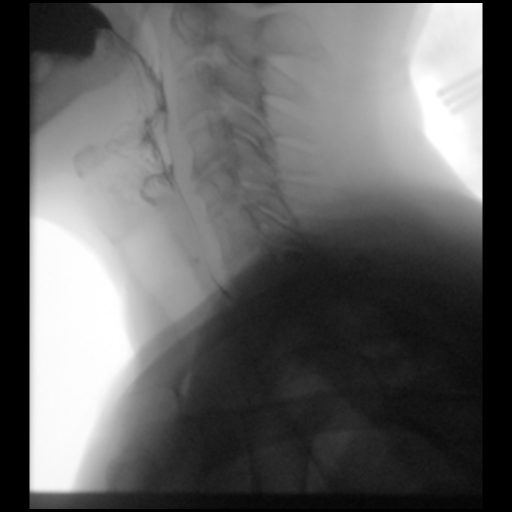

[Series 9: cp_standard · 0.27mm/px · 1 of 141 frames shown (8 of 12)]
[frame 24/141]
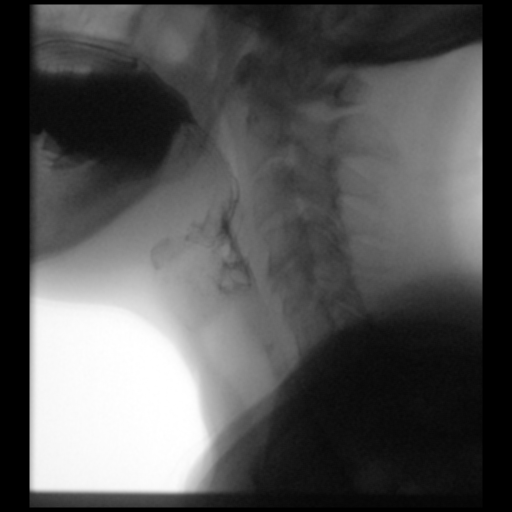

[Series 10: cp_standard · 0.18mm/px · 1 of 16 frames shown (9 of 12)]
[frame 9/16]
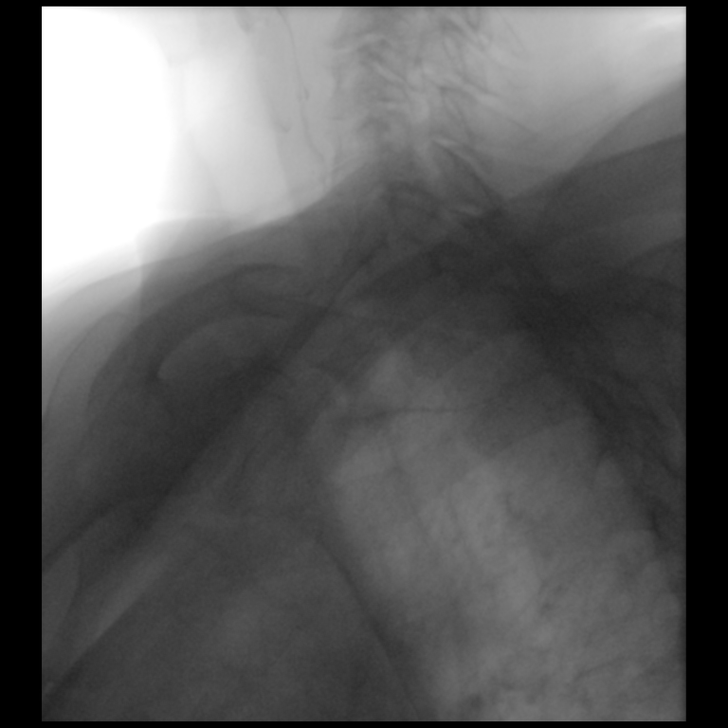

[Series 11: cp_standard · 0.18mm/px · 1 of 60 frames shown (10 of 12)]
[frame 31/60]
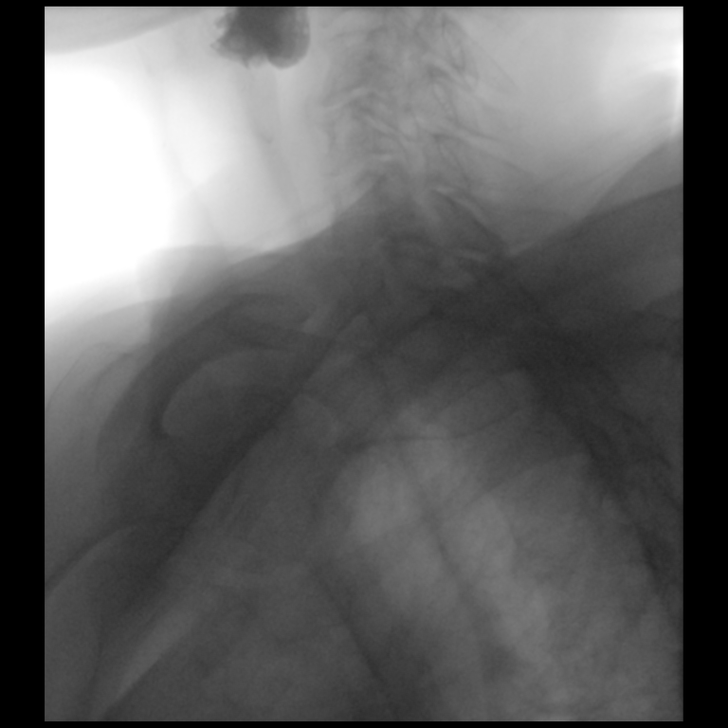

[Series 12: cp_standard · 0.18mm/px · 1 of 20 frames shown (11 of 12)]
[frame 18/20]
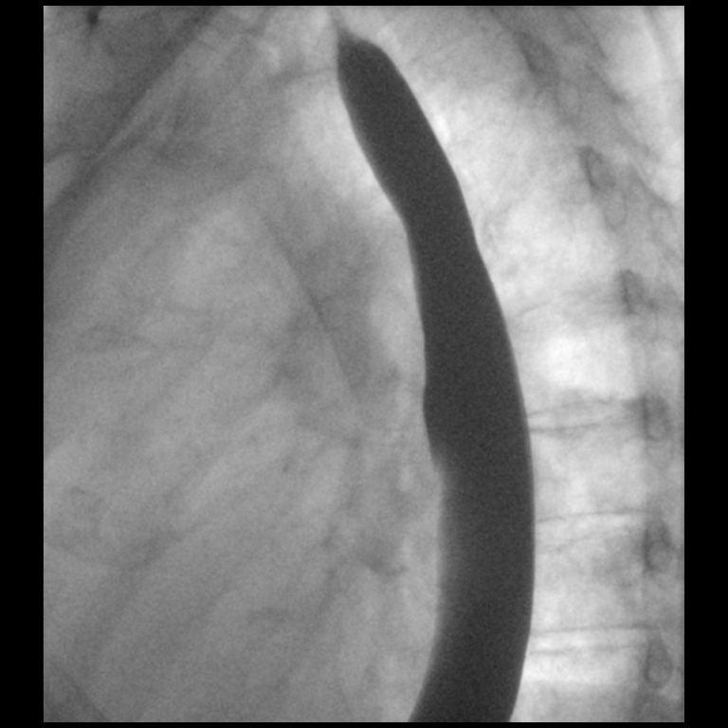

[Series 13: cp_standard · 0.18mm/px · 1 of 28 frames shown (12 of 12)]
[frame 24/28]
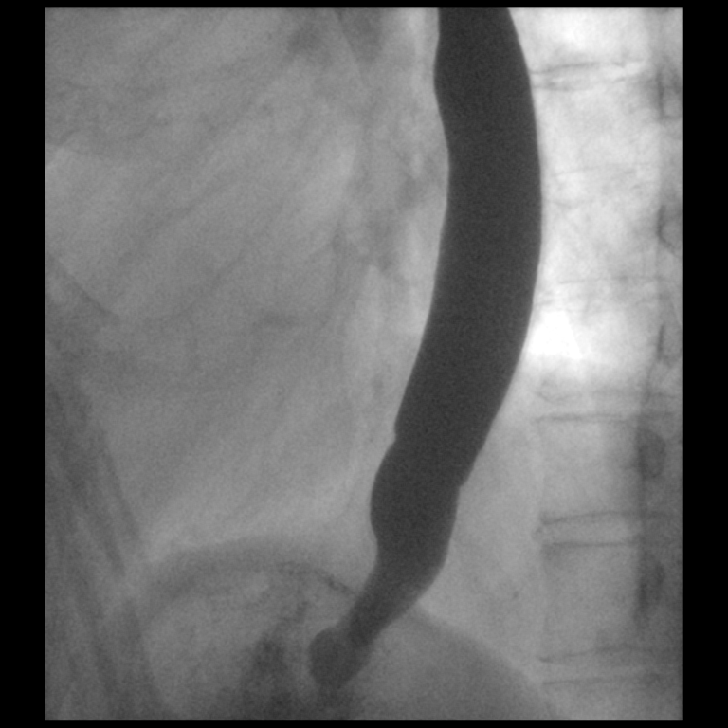

[12 of 24 positions shown; findings below may reference images not displayed]

FINDINGS: Esophageal distention: Normal without mass or stricture

Filling defects: Tiny indentations at the distal esophagus likely
representing forme fruste of a Schatzki ring

12.5 mm barium tablet: Easily passed from oral cavity to stomach
without obstruction

Motility:  Normal

Mucosa:  Smooth without irregularity or ulceration

Hypopharynx/cervical esophagus: Normal motion without laryngeal
penetration or aspiration. No filling defects.

Hiatal hernia:  Absent

GE reflux:  Not identified during exam.

Other:  N/A
IMPRESSION: No evidence of esophageal mass, stricture, or retained food.

Forme fruste of a Schatzki ring at the distal esophagus.

## 2019-09-24 ENCOUNTER — Encounter: Payer: Self-pay | Admitting: Internal Medicine

## 2019-10-28 DIAGNOSIS — Z299 Encounter for prophylactic measures, unspecified: Secondary | ICD-10-CM | POA: Diagnosis not present

## 2019-10-28 DIAGNOSIS — I10 Essential (primary) hypertension: Secondary | ICD-10-CM | POA: Diagnosis not present

## 2019-10-28 DIAGNOSIS — Z6825 Body mass index (BMI) 25.0-25.9, adult: Secondary | ICD-10-CM | POA: Diagnosis not present

## 2019-10-28 DIAGNOSIS — Z2821 Immunization not carried out because of patient refusal: Secondary | ICD-10-CM | POA: Diagnosis not present

## 2019-10-28 DIAGNOSIS — Z713 Dietary counseling and surveillance: Secondary | ICD-10-CM | POA: Diagnosis not present

## 2019-10-29 ENCOUNTER — Encounter: Payer: Self-pay | Admitting: Nurse Practitioner

## 2019-10-29 ENCOUNTER — Ambulatory Visit (INDEPENDENT_AMBULATORY_CARE_PROVIDER_SITE_OTHER): Payer: Medicare HMO | Admitting: Nurse Practitioner

## 2019-10-29 ENCOUNTER — Other Ambulatory Visit: Payer: Self-pay

## 2019-10-29 VITALS — BP 152/74 | HR 70 | Temp 97.0°F | Ht 66.0 in | Wt 153.0 lb

## 2019-10-29 DIAGNOSIS — K219 Gastro-esophageal reflux disease without esophagitis: Secondary | ICD-10-CM

## 2019-10-29 DIAGNOSIS — K649 Unspecified hemorrhoids: Secondary | ICD-10-CM | POA: Diagnosis not present

## 2019-10-29 DIAGNOSIS — R197 Diarrhea, unspecified: Secondary | ICD-10-CM

## 2019-10-29 NOTE — Patient Instructions (Signed)
Your health issues we discussed today were:   GERD (reflux/heartburn): 1. I am glad you are doing well! 2. Continue to take your acid blocker (Protonix) as you have been 3. Call us for any worsening or severe symptoms  Rectal bleeding several months ago with known hemorrhoids: 1. Your rectal bleeding was most likely due to the loose frequent stools you are having at the time.   2. I am glad this resolved.  If you have recurrent bleeding you can always call us.  Diarrhea likely due to lactose intolerance: 1. Continue avoiding dairy as you have been 2. You can look into other products such as coconut milk, soy milk, almond milk-based products 3. Dorris Fetch life is a brand of milk that also adds Lactaid to their products to make them lactose-free 4. Call us if you have any questions or worsening/recurrent symptoms  Overall I recommend:  1. Continue your other current medications 2. Return for follow-up in 1 year 3. Call us if you have any questions or concerns.   Because of recent events of COVID-19 ("Coronavirus"), follow CDC recommendations:  Wash your hand frequently Avoid touching your face Stay away from people who are sick If you have symptoms such as fever, cough, shortness of breath then call your healthcare provider for further guidance If you are sick, STAY AT HOME unless otherwise directed by your healthcare provider. Follow directions from state and national officials regarding staying safe   At [facility] we value your feedback. You may receive a survey about your visit today. Please share your experience as we strive to create trusting relationships with our patients to provide genuine, compassionate, quality care.  We appreciate your understanding and patience as we review any laboratory studies, imaging, and other diagnostic tests that are ordered as we care for you. Our office policy is 5 business days for review of these results, and any emergent or  urgent results are addressed in a timely manner for your best interest. If you do not hear from our office in 1 week, please contact us.   We also encourage the use of MyChart, which contains your medical information for your review as well. If you are not enrolled in this feature, an access code is on this after visit summary for your convenience. Thank you for allowing [place] to be involved in your care.  It was great to see you today!  I hope you have a Happy New Year!!

## 2019-10-29 NOTE — Assessment & Plan Note (Signed)
She is doing well on daily Protonix.  No significant breakthrough symptoms.  Recommended continue trigger avoidance, PPI, follow-up in 1 year.

## 2019-10-29 NOTE — Assessment & Plan Note (Signed)
Known hemorrhoids.  Episode of frequent loose stools 9 months ago associated with hemorrhoid bleeding likely as a result of the frequent loose diarrhea.  These both self resolved and no recurrence since.  Would recommend Preparation H or Anusol as needed for hemorrhoid symptoms.  Recommended that she call us with any recurrent bleeding.  Follow-up in 1 year.

## 2019-10-29 NOTE — Assessment & Plan Note (Signed)
Diarrhea doing quite well.  Her last episode was about a couple months ago when she had a small dish of ice cream.  At this point I feel her symptoms are most definitely due to to lactose intolerance.  Since avoiding dairy she has had near complete resolution of all her symptoms.  Recommend she continue with dairy avoidance including the use of nondairy milk like products such as coconut ice cream, [place] milk, soy milk products.  Can also use Lactaid as needed.  I discussed other brands of milk such as fair life which adds Lactaid into their milk to make it lactose-free.  Call for any worsening or severe symptoms and follow-up in 1 year.

## 2019-10-29 NOTE — Progress Notes (Signed)
Referring Provider: Ignatius Specking, MD Primary Care Physician:  Ignatius Specking, MD Primary GI:  Dr. Jena Gauss  Chief Complaint  Patient presents with  . Gastroesophageal Reflux    f/u, doing ok    HPI:   Rhonda Cox is a 63 y.o. female who presents for 1 year follow-up.  The patient was last seen in our office 10/03/2018 for diarrhea, ischemic colitis, GERD.  Noted history of GERD on Protonix.  Ischemic colitis diagnosed on colonoscopy in 2014 with Dr. Teena Dunk based on biopsies.  Recommended 10-year repeat colonoscopy and avoid all NSAIDs.  Chronic diarrhea with hemorrhoids and rectal bleeding about once a month, Preparation H improved hemorrhoid symptoms.  Noted diarrhea consistently due to dairy products.  Has been avoiding dairy.    At her last visit GERD doing well on PPI and trigger avoidance, diarrhea doing great with dairy avoidance with lactose free milk and ice cream.  No other overt GI symptoms.  Recommended avoidance, continue current medications, follow-up in 1 year.  Today she states she's doing well overall. GERD well controlled on Protonix with no breakthrough. Diarrhea still doing well with avoiding dairy; last episode about 9 months ago with frequent loose stools; associated with cramping. Did have some mild hemorrhoid bleeding. Symptoms resolved with observation. Denies URI or flu-like symptoms. Denies loss of sense of taste or smell. Denies chest pain, dyspnea, dizziness, lightheadedness, syncope, near syncope. Denies any other upper or lower GI symptoms.  Past Medical History:  Diagnosis Date  . DDD (degenerative disc disease), cervical     Past Surgical History:  Procedure Laterality Date  . LUMBAR SPINE SURGERY     x 6  . TUBAL LIGATION      Current Outpatient Medications  Medication Sig Dispense Refill  . acetaminophen (TYLENOL) 500 MG tablet Take 500 mg by mouth as needed.    . Calcium Carb-Cholecalciferol (CALCIUM 1000 + D) 1000-800 MG-UNIT TABS Take 1 tablet by  mouth daily.     Marland Kitchen lisinopril (PRINIVIL,ZESTRIL) 10 MG tablet Take 10 mg by mouth daily.  4  . pantoprazole (PROTONIX) 40 MG tablet Take 1 tablet by mouth as needed.     . traMADol (ULTRAM) 50 MG tablet Take 50 mg by mouth as needed.     No current facility-administered medications for this visit.    Allergies as of 10/29/2019 - Review Complete 10/29/2019  Allergen Reaction Noted  . Bactrim [sulfamethoxazole-trimethoprim] Nausea And Vomiting 10/03/2018  . Ciprofloxacin Nausea And Vomiting 01/24/2018  . Hydrocodone Diarrhea and Other (See Comments) 01/24/2018  . Nsaids Other (See Comments) 01/24/2018    Family History  Problem Relation Age of Onset  . Colon cancer Neg Hx     Social History   Socioeconomic History  . Marital status: Widowed    Spouse name: Not on file  . Number of children: Not on file  . Years of education: Not on file  . Highest education level: Not on file  Occupational History  . Not on file  Tobacco Use  . Smoking status: Never Smoker  . Smokeless tobacco: Never Used  Substance and Sexual Activity  . Alcohol use: Never  . Drug use: Never  . Sexual activity: Not on file  Other Topics Concern  . Not on file  Social History Narrative  . Not on file   Social Determinants of Health   Financial Resource Strain:   . Difficulty of Paying Living Expenses: Not on file  Food Insecurity:   .  Worried About Charity fundraiser in the Last Year: Not on file  . Ran Out of Food in the Last Year: Not on file  Transportation Needs:   . Lack of Transportation (Medical): Not on file  . Lack of Transportation (Non-Medical): Not on file  Physical Activity:   . Days of Exercise per Week: Not on file  . Minutes of Exercise per Session: Not on file  Stress:   . Feeling of Stress : Not on file  Social Connections:   . Frequency of Communication with Friends and Family: Not on file  . Frequency of Social Gatherings with Friends and Family: Not on file  . Attends  Religious Services: Not on file  . Active Member of Clubs or Organizations: Not on file  . Attends Archivist Meetings: Not on file  . Marital Status: Not on file    Review of Systems: General: Negative for anorexia, weight loss, fever, chills, fatigue, weakness. ENT: Negative for hoarseness, difficulty swallowing. CV: Negative for chest pain, angina, palpitations, peripheral edema.  Respiratory: Negative for dyspnea at rest, cough, sputum, wheezing.  GI: See history of present illness. Endo: Negative for unusual weight change.  Heme: Negative for bruising or bleeding. Allergy: Negative for rash or hives.   Physical Exam: BP (!) 152/74   Pulse 70   Temp (!) 97 F (36.1 C) (Temporal)   Ht 5\' 6"  (1.676 m)   Wt 153 lb (69.4 kg)   LMP 06/04/2010 (Approximate)   BMI 24.69 kg/m  General:   Alert and oriented. Pleasant and cooperative. Well-nourished and well-developed.  Eyes:  Without icterus, sclera clear and conjunctiva pink.  Ears:  Normal auditory acuity. Cardiovascular:  S1, S2 present without murmurs appreciated. Extremities without clubbing or edema. Respiratory:  Clear to auscultation bilaterally. No wheezes, rales, or rhonchi. No distress.  Gastrointestinal:  +BS, soft, non-tender and non-distended. No HSM noted. No guarding or rebound. No masses appreciated.  Rectal:  Deferred  Musculoskalatal:  Symmetrical without gross deformities. Neurologic:  Alert and oriented x4;  grossly normal neurologically. Psych:  Alert and cooperative. Normal mood and affect. Heme/Lymph/Immune: No excessive bruising noted.    10/29/2019 8:53 AM   Disclaimer: This note was dictated with voice recognition software. Similar sounding words can inadvertently be transcribed and may not be corrected upon review.

## 2019-12-03 DIAGNOSIS — Z6825 Body mass index (BMI) 25.0-25.9, adult: Secondary | ICD-10-CM | POA: Diagnosis not present

## 2019-12-03 DIAGNOSIS — I10 Essential (primary) hypertension: Secondary | ICD-10-CM | POA: Diagnosis not present

## 2019-12-03 DIAGNOSIS — Z789 Other specified health status: Secondary | ICD-10-CM | POA: Diagnosis not present

## 2019-12-03 DIAGNOSIS — R350 Frequency of micturition: Secondary | ICD-10-CM | POA: Diagnosis not present

## 2019-12-03 DIAGNOSIS — M62838 Other muscle spasm: Secondary | ICD-10-CM | POA: Diagnosis not present

## 2019-12-03 DIAGNOSIS — Z299 Encounter for prophylactic measures, unspecified: Secondary | ICD-10-CM | POA: Diagnosis not present

## 2019-12-23 DIAGNOSIS — M25529 Pain in unspecified elbow: Secondary | ICD-10-CM | POA: Diagnosis not present

## 2019-12-23 DIAGNOSIS — Z789 Other specified health status: Secondary | ICD-10-CM | POA: Diagnosis not present

## 2019-12-23 DIAGNOSIS — K559 Vascular disorder of intestine, unspecified: Secondary | ICD-10-CM | POA: Diagnosis not present

## 2019-12-23 DIAGNOSIS — I10 Essential (primary) hypertension: Secondary | ICD-10-CM | POA: Diagnosis not present

## 2019-12-23 DIAGNOSIS — E7800 Pure hypercholesterolemia, unspecified: Secondary | ICD-10-CM | POA: Diagnosis not present

## 2019-12-23 DIAGNOSIS — Z299 Encounter for prophylactic measures, unspecified: Secondary | ICD-10-CM | POA: Diagnosis not present

## 2019-12-23 DIAGNOSIS — M25522 Pain in left elbow: Secondary | ICD-10-CM | POA: Diagnosis not present

## 2019-12-23 DIAGNOSIS — Z6825 Body mass index (BMI) 25.0-25.9, adult: Secondary | ICD-10-CM | POA: Diagnosis not present

## 2020-01-08 DIAGNOSIS — H2513 Age-related nuclear cataract, bilateral: Secondary | ICD-10-CM | POA: Diagnosis not present

## 2020-01-08 DIAGNOSIS — H40033 Anatomical narrow angle, bilateral: Secondary | ICD-10-CM | POA: Diagnosis not present

## 2020-01-29 DIAGNOSIS — R21 Rash and other nonspecific skin eruption: Secondary | ICD-10-CM | POA: Diagnosis not present

## 2020-01-29 DIAGNOSIS — K589 Irritable bowel syndrome without diarrhea: Secondary | ICD-10-CM | POA: Diagnosis not present

## 2020-04-14 DIAGNOSIS — Z299 Encounter for prophylactic measures, unspecified: Secondary | ICD-10-CM | POA: Diagnosis not present

## 2020-04-14 DIAGNOSIS — Z79899 Other long term (current) drug therapy: Secondary | ICD-10-CM | POA: Diagnosis not present

## 2020-04-14 DIAGNOSIS — Z7189 Other specified counseling: Secondary | ICD-10-CM | POA: Diagnosis not present

## 2020-04-14 DIAGNOSIS — Z1211 Encounter for screening for malignant neoplasm of colon: Secondary | ICD-10-CM | POA: Diagnosis not present

## 2020-04-14 DIAGNOSIS — Z1339 Encounter for screening examination for other mental health and behavioral disorders: Secondary | ICD-10-CM | POA: Diagnosis not present

## 2020-04-14 DIAGNOSIS — R5383 Other fatigue: Secondary | ICD-10-CM | POA: Diagnosis not present

## 2020-04-14 DIAGNOSIS — Z6820 Body mass index (BMI) 20.0-20.9, adult: Secondary | ICD-10-CM | POA: Diagnosis not present

## 2020-04-14 DIAGNOSIS — I10 Essential (primary) hypertension: Secondary | ICD-10-CM | POA: Diagnosis not present

## 2020-04-14 DIAGNOSIS — E7800 Pure hypercholesterolemia, unspecified: Secondary | ICD-10-CM | POA: Diagnosis not present

## 2020-04-14 DIAGNOSIS — Z1331 Encounter for screening for depression: Secondary | ICD-10-CM | POA: Diagnosis not present

## 2020-04-14 DIAGNOSIS — Z0000 Encounter for general adult medical examination without abnormal findings: Secondary | ICD-10-CM | POA: Diagnosis not present

## 2020-04-27 DIAGNOSIS — Z299 Encounter for prophylactic measures, unspecified: Secondary | ICD-10-CM | POA: Diagnosis not present

## 2020-04-27 DIAGNOSIS — Z789 Other specified health status: Secondary | ICD-10-CM | POA: Diagnosis not present

## 2020-04-27 DIAGNOSIS — M545 Low back pain: Secondary | ICD-10-CM | POA: Diagnosis not present

## 2020-04-27 DIAGNOSIS — I10 Essential (primary) hypertension: Secondary | ICD-10-CM | POA: Diagnosis not present

## 2020-04-27 DIAGNOSIS — R109 Unspecified abdominal pain: Secondary | ICD-10-CM | POA: Diagnosis not present

## 2020-04-29 DIAGNOSIS — K589 Irritable bowel syndrome without diarrhea: Secondary | ICD-10-CM | POA: Diagnosis not present

## 2020-04-29 DIAGNOSIS — K219 Gastro-esophageal reflux disease without esophagitis: Secondary | ICD-10-CM | POA: Diagnosis not present

## 2020-05-01 DIAGNOSIS — R109 Unspecified abdominal pain: Secondary | ICD-10-CM | POA: Diagnosis not present

## 2020-05-01 DIAGNOSIS — R1031 Right lower quadrant pain: Secondary | ICD-10-CM | POA: Diagnosis not present

## 2020-05-01 DIAGNOSIS — Z299 Encounter for prophylactic measures, unspecified: Secondary | ICD-10-CM | POA: Diagnosis not present

## 2020-05-01 DIAGNOSIS — I10 Essential (primary) hypertension: Secondary | ICD-10-CM | POA: Diagnosis not present

## 2020-05-01 DIAGNOSIS — Z981 Arthrodesis status: Secondary | ICD-10-CM | POA: Diagnosis not present

## 2020-05-01 DIAGNOSIS — I700 Atherosclerosis of aorta: Secondary | ICD-10-CM | POA: Diagnosis not present

## 2020-05-11 DIAGNOSIS — E2839 Other primary ovarian failure: Secondary | ICD-10-CM | POA: Diagnosis not present

## 2020-05-26 DIAGNOSIS — Z299 Encounter for prophylactic measures, unspecified: Secondary | ICD-10-CM | POA: Diagnosis not present

## 2020-05-26 DIAGNOSIS — M8580 Other specified disorders of bone density and structure, unspecified site: Secondary | ICD-10-CM | POA: Diagnosis not present

## 2020-05-26 DIAGNOSIS — I10 Essential (primary) hypertension: Secondary | ICD-10-CM | POA: Diagnosis not present

## 2020-05-29 DIAGNOSIS — K219 Gastro-esophageal reflux disease without esophagitis: Secondary | ICD-10-CM | POA: Diagnosis not present

## 2020-05-29 DIAGNOSIS — K589 Irritable bowel syndrome without diarrhea: Secondary | ICD-10-CM | POA: Diagnosis not present

## 2020-06-09 DIAGNOSIS — J069 Acute upper respiratory infection, unspecified: Secondary | ICD-10-CM | POA: Diagnosis not present

## 2020-06-09 DIAGNOSIS — E7800 Pure hypercholesterolemia, unspecified: Secondary | ICD-10-CM | POA: Diagnosis not present

## 2020-06-09 DIAGNOSIS — Z299 Encounter for prophylactic measures, unspecified: Secondary | ICD-10-CM | POA: Diagnosis not present

## 2020-06-09 DIAGNOSIS — I10 Essential (primary) hypertension: Secondary | ICD-10-CM | POA: Diagnosis not present

## 2020-07-17 DIAGNOSIS — I10 Essential (primary) hypertension: Secondary | ICD-10-CM | POA: Diagnosis not present

## 2020-07-17 DIAGNOSIS — Z299 Encounter for prophylactic measures, unspecified: Secondary | ICD-10-CM | POA: Diagnosis not present

## 2020-07-17 DIAGNOSIS — Z5320 Procedure and treatment not carried out because of patient's decision for unspecified reasons: Secondary | ICD-10-CM | POA: Diagnosis not present

## 2020-07-17 DIAGNOSIS — M8580 Other specified disorders of bone density and structure, unspecified site: Secondary | ICD-10-CM | POA: Diagnosis not present

## 2020-07-30 DIAGNOSIS — K219 Gastro-esophageal reflux disease without esophagitis: Secondary | ICD-10-CM | POA: Diagnosis not present

## 2020-07-30 DIAGNOSIS — K589 Irritable bowel syndrome without diarrhea: Secondary | ICD-10-CM | POA: Diagnosis not present

## 2020-08-06 DIAGNOSIS — R102 Pelvic and perineal pain: Secondary | ICD-10-CM | POA: Diagnosis not present

## 2020-08-06 DIAGNOSIS — Z124 Encounter for screening for malignant neoplasm of cervix: Secondary | ICD-10-CM | POA: Diagnosis not present

## 2020-08-06 DIAGNOSIS — N841 Polyp of cervix uteri: Secondary | ICD-10-CM | POA: Diagnosis not present

## 2020-08-06 DIAGNOSIS — Z6824 Body mass index (BMI) 24.0-24.9, adult: Secondary | ICD-10-CM | POA: Diagnosis not present

## 2020-09-29 ENCOUNTER — Encounter: Payer: Self-pay | Admitting: Internal Medicine

## 2020-10-16 DIAGNOSIS — Z299 Encounter for prophylactic measures, unspecified: Secondary | ICD-10-CM | POA: Diagnosis not present

## 2020-10-16 DIAGNOSIS — I10 Essential (primary) hypertension: Secondary | ICD-10-CM | POA: Diagnosis not present

## 2020-10-16 DIAGNOSIS — M8580 Other specified disorders of bone density and structure, unspecified site: Secondary | ICD-10-CM | POA: Diagnosis not present

## 2020-10-30 DIAGNOSIS — M8580 Other specified disorders of bone density and structure, unspecified site: Secondary | ICD-10-CM | POA: Diagnosis not present

## 2020-10-30 DIAGNOSIS — E7849 Other hyperlipidemia: Secondary | ICD-10-CM | POA: Diagnosis not present

## 2020-10-30 DIAGNOSIS — K219 Gastro-esophageal reflux disease without esophagitis: Secondary | ICD-10-CM | POA: Diagnosis not present

## 2020-11-13 DIAGNOSIS — I700 Atherosclerosis of aorta: Secondary | ICD-10-CM | POA: Diagnosis not present

## 2020-11-13 DIAGNOSIS — R109 Unspecified abdominal pain: Secondary | ICD-10-CM | POA: Diagnosis not present

## 2020-11-13 DIAGNOSIS — Z299 Encounter for prophylactic measures, unspecified: Secondary | ICD-10-CM | POA: Diagnosis not present

## 2020-11-13 DIAGNOSIS — I10 Essential (primary) hypertension: Secondary | ICD-10-CM | POA: Diagnosis not present

## 2020-11-24 ENCOUNTER — Encounter: Payer: Self-pay | Admitting: Internal Medicine

## 2020-11-24 ENCOUNTER — Ambulatory Visit: Payer: Medicare HMO | Admitting: Internal Medicine

## 2020-11-24 ENCOUNTER — Other Ambulatory Visit: Payer: Self-pay

## 2020-11-24 VITALS — BP 151/76 | HR 58 | Temp 97.2°F | Ht 66.0 in | Wt 154.8 lb

## 2020-11-24 DIAGNOSIS — R1031 Right lower quadrant pain: Secondary | ICD-10-CM | POA: Diagnosis not present

## 2020-11-24 DIAGNOSIS — G8929 Other chronic pain: Secondary | ICD-10-CM | POA: Diagnosis not present

## 2020-11-24 NOTE — Progress Notes (Signed)
Primary Care Physician:  Ignatius Specking, MD Primary Gastroenterologist:  Dr. Jena Gauss  Pre-Procedure History & Physical: HPI:  Rhonda Cox is a 65 y.o. female here for 1 year follow-up GERD/diarrhea.  Diarrhea has resolved.  GERD well controlled on Protonix 40 mg daily.  No dysphagia.  Denies rectal bleeding; due for routine screening colonoscopy 2024.  Patient states since she was last here,  she had an episode of right flank/ right lower quadrant abdominal pain last summer.  CT noncontrast done by Dr. Sherril Croon.  Degenerative joint disease of the spine noted but nothing else.  Appendix not seen well (no appendectomy by history)  -  symptoms resolved in a couple of days;   2 weeks ago she had similar recurrence right lower quadrant abdominal pain - not associated with bowel function or urinary tract symptoms.  No fever or chills.  Denies right upper quadrant pain;  unsure if is she perceives a radicular component or not.  Those symptoms, as well, have totally resolved.  She is back to baseline.  She is able to eat without any difficulties and feels she is doing well at this time.  Past Medical History:  Diagnosis Date  . DDD (degenerative disc disease), cervical     Past Surgical History:  Procedure Laterality Date  . LUMBAR SPINE SURGERY     x 6  . TUBAL LIGATION      Prior to Admission medications   Medication Sig Start Date End Date Taking? Authorizing Provider  acetaminophen (TYLENOL) 500 MG tablet Take 500 mg by mouth as needed.   Yes [provider]  Calcium Carb-Cholecalciferol 1000-800 MG-UNIT TABS Take 1 tablet by mouth daily.    Yes [provider]  lisinopril (PRINIVIL,ZESTRIL) 10 MG tablet Take 10 mg by mouth daily. 08/23/18  Yes [provider]  pantoprazole (PROTONIX) 40 MG tablet Take 1 tablet by mouth as needed.  11/27/17  Yes [provider]  traMADol (ULTRAM) 50 MG tablet Take 50 mg by mouth as needed. 12/18/17  Yes [provider]     Allergies as of 11/24/2020 - Review Complete 11/24/2020  Allergen Reaction Noted  . Bactrim [sulfamethoxazole-trimethoprim] Nausea And Vomiting 10/03/2018  . Ciprofloxacin Nausea And Vomiting 01/24/2018  . Hydrocodone Diarrhea and Other (See Comments) 01/24/2018  . Nsaids Other (See Comments) 01/24/2018    Family History  Problem Relation Age of Onset  . Colon cancer Neg Hx     Social History   Socioeconomic History  . Marital status: Widowed    Spouse name: Not on file  . Number of children: Not on file  . Years of education: Not on file  . Highest education level: Not on file  Occupational History  . Not on file  Tobacco Use  . Smoking status: Never Smoker  . Smokeless tobacco: Never Used  Substance and Sexual Activity  . Alcohol use: Never  . Drug use: Never  . Sexual activity: Not on file  Other Topics Concern  . Not on file  Social History Narrative  . Not on file   Social Determinants of Health   Financial Resource Strain: Not on file  Food Insecurity: Not on file  Transportation Needs: Not on file  Physical Activity: Not on file  Stress: Not on file  Social Connections: Not on file  Intimate Partner Violence: Not on file    Review of Systems: See HPI, otherwise negative ROS  Physical Exam: BP (!) 151/76   Pulse (!) 58  Temp (!) 97.2 F (36.2 C)   Ht 5\' 6"  (1.676 m)   Wt 154 lb 12.8 oz (70.2 kg)   LMP 06/04/2010 (Approximate)   BMI 24.99 kg/m  General:   Alert,   pleasant and cooperative in NAD Neck:  Supple; no masses or thyromegaly. No significant cervical adenopathy. Lungs:  Clear throughout to auscultation.   No wheezes, crackles, or rhonchi. No acute distress. Heart:  Regular rate and rhythm; no murmurs, clicks, rubs,  or gallops. Abdomen: Non-distended, normal bowel sounds.  Soft and nontender without appreciable mass or hepatosplenomegaly.  Pulses:  Normal pulses noted. Extremities:  Without clubbing or edema.  Back no  tenderness.. Rectal exam: No external lesions.  Good sphincter tone.  No mass in rectal vault.  Scant brown stool.  Hemoccult negative. Impression/Plan: 65 year old lady with GERD well-controlled on Protonix 40 mg daily.  No further diarrhea or rectal bleeding.  She has had 2 well-defined episodes of right lower quadrant / right flank pain without associated symptoms over the past 6 months. Symptoms self-limiting.  Suspect musculoskeletal and/or radicular component she has significant degenerative spine disease.  I doubt ongoing visceral pathology at this point given the prior negative CT,today's examination and paucity of symptoms..  Hopefully, she will have no further episodes.  Recommendations:  Continue Protonix 40 mg daily  Plan for screening colonoscopy 2024  I reviewed CT scan done last year at [facility].  I am glad your abdominal pain has resolved.  I am not convinced pain is coming from your GI tract.  A musculoskeletal or radicular source is certainly possible.  If it recurs,  please call [facility]; otherwise plan for an office follow-up in 1 year.      Notice: This dictation was prepared with Dragon dictation along with smaller phrase technology. Any transcriptional errors that result from this process are unintentional and may not be corrected upon review.

## 2020-11-24 NOTE — Patient Instructions (Addendum)
Continue Protonix 40 mg daily  Plan for screening colonoscopy 2024  I reviewed CT scan done last year at [facility].  I am glad your abdominal pain has resolved.  I am not convinced pain is coming from your GI tract.  A musculoskeletal or radicular source is certainly possible.  If it recurs,  please call us; otherwise plan for an office follow-up in 1 year.

## 2020-11-30 DIAGNOSIS — K219 Gastro-esophageal reflux disease without esophagitis: Secondary | ICD-10-CM | POA: Diagnosis not present

## 2020-11-30 DIAGNOSIS — M8580 Other specified disorders of bone density and structure, unspecified site: Secondary | ICD-10-CM | POA: Diagnosis not present

## 2020-11-30 DIAGNOSIS — E7849 Other hyperlipidemia: Secondary | ICD-10-CM | POA: Diagnosis not present

## 2020-12-28 DIAGNOSIS — K219 Gastro-esophageal reflux disease without esophagitis: Secondary | ICD-10-CM | POA: Diagnosis not present

## 2020-12-28 DIAGNOSIS — E7849 Other hyperlipidemia: Secondary | ICD-10-CM | POA: Diagnosis not present

## 2020-12-28 DIAGNOSIS — M8580 Other specified disorders of bone density and structure, unspecified site: Secondary | ICD-10-CM | POA: Diagnosis not present

## 2021-01-27 DIAGNOSIS — I10 Essential (primary) hypertension: Secondary | ICD-10-CM | POA: Diagnosis not present

## 2021-01-27 DIAGNOSIS — Z299 Encounter for prophylactic measures, unspecified: Secondary | ICD-10-CM | POA: Diagnosis not present

## 2021-01-27 DIAGNOSIS — I700 Atherosclerosis of aorta: Secondary | ICD-10-CM | POA: Diagnosis not present

## 2021-01-27 DIAGNOSIS — E7800 Pure hypercholesterolemia, unspecified: Secondary | ICD-10-CM | POA: Diagnosis not present

## 2021-02-26 DIAGNOSIS — I10 Essential (primary) hypertension: Secondary | ICD-10-CM | POA: Diagnosis not present

## 2021-02-27 DIAGNOSIS — E7849 Other hyperlipidemia: Secondary | ICD-10-CM | POA: Diagnosis not present

## 2021-02-27 DIAGNOSIS — M8580 Other specified disorders of bone density and structure, unspecified site: Secondary | ICD-10-CM | POA: Diagnosis not present

## 2021-02-27 DIAGNOSIS — K219 Gastro-esophageal reflux disease without esophagitis: Secondary | ICD-10-CM | POA: Diagnosis not present

## 2021-03-30 DIAGNOSIS — I10 Essential (primary) hypertension: Secondary | ICD-10-CM | POA: Diagnosis not present

## 2021-03-30 DIAGNOSIS — H524 Presbyopia: Secondary | ICD-10-CM | POA: Diagnosis not present

## 2021-04-26 DIAGNOSIS — H18511 Endothelial corneal dystrophy, right eye: Secondary | ICD-10-CM | POA: Diagnosis not present

## 2021-04-29 DIAGNOSIS — I10 Essential (primary) hypertension: Secondary | ICD-10-CM | POA: Diagnosis not present

## 2021-04-30 DIAGNOSIS — I10 Essential (primary) hypertension: Secondary | ICD-10-CM | POA: Diagnosis not present

## 2021-04-30 DIAGNOSIS — Z299 Encounter for prophylactic measures, unspecified: Secondary | ICD-10-CM | POA: Diagnosis not present

## 2021-05-11 DIAGNOSIS — I700 Atherosclerosis of aorta: Secondary | ICD-10-CM | POA: Diagnosis not present

## 2021-05-11 DIAGNOSIS — M542 Cervicalgia: Secondary | ICD-10-CM | POA: Diagnosis not present

## 2021-05-11 DIAGNOSIS — Z299 Encounter for prophylactic measures, unspecified: Secondary | ICD-10-CM | POA: Diagnosis not present

## 2021-05-11 DIAGNOSIS — I10 Essential (primary) hypertension: Secondary | ICD-10-CM | POA: Diagnosis not present

## 2021-05-28 DIAGNOSIS — I10 Essential (primary) hypertension: Secondary | ICD-10-CM | POA: Diagnosis not present

## 2021-05-30 DIAGNOSIS — M8580 Other specified disorders of bone density and structure, unspecified site: Secondary | ICD-10-CM | POA: Diagnosis not present

## 2021-05-30 DIAGNOSIS — E7849 Other hyperlipidemia: Secondary | ICD-10-CM | POA: Diagnosis not present

## 2021-05-30 DIAGNOSIS — K219 Gastro-esophageal reflux disease without esophagitis: Secondary | ICD-10-CM | POA: Diagnosis not present

## 2021-05-31 DIAGNOSIS — Z7189 Other specified counseling: Secondary | ICD-10-CM | POA: Diagnosis not present

## 2021-05-31 DIAGNOSIS — Z79899 Other long term (current) drug therapy: Secondary | ICD-10-CM | POA: Diagnosis not present

## 2021-05-31 DIAGNOSIS — I700 Atherosclerosis of aorta: Secondary | ICD-10-CM | POA: Diagnosis not present

## 2021-05-31 DIAGNOSIS — Z789 Other specified health status: Secondary | ICD-10-CM | POA: Diagnosis not present

## 2021-05-31 DIAGNOSIS — I10 Essential (primary) hypertension: Secondary | ICD-10-CM | POA: Diagnosis not present

## 2021-05-31 DIAGNOSIS — Z1339 Encounter for screening examination for other mental health and behavioral disorders: Secondary | ICD-10-CM | POA: Diagnosis not present

## 2021-05-31 DIAGNOSIS — R5383 Other fatigue: Secondary | ICD-10-CM | POA: Diagnosis not present

## 2021-05-31 DIAGNOSIS — Z299 Encounter for prophylactic measures, unspecified: Secondary | ICD-10-CM | POA: Diagnosis not present

## 2021-05-31 DIAGNOSIS — Z1331 Encounter for screening for depression: Secondary | ICD-10-CM | POA: Diagnosis not present

## 2021-05-31 DIAGNOSIS — Z0000 Encounter for general adult medical examination without abnormal findings: Secondary | ICD-10-CM | POA: Diagnosis not present

## 2021-05-31 DIAGNOSIS — Z6825 Body mass index (BMI) 25.0-25.9, adult: Secondary | ICD-10-CM | POA: Diagnosis not present

## 2021-06-16 DIAGNOSIS — Z299 Encounter for prophylactic measures, unspecified: Secondary | ICD-10-CM | POA: Diagnosis not present

## 2021-06-16 DIAGNOSIS — R21 Rash and other nonspecific skin eruption: Secondary | ICD-10-CM | POA: Diagnosis not present

## 2021-06-30 DIAGNOSIS — I10 Essential (primary) hypertension: Secondary | ICD-10-CM | POA: Diagnosis not present

## 2021-07-30 DIAGNOSIS — I10 Essential (primary) hypertension: Secondary | ICD-10-CM | POA: Diagnosis not present

## 2021-08-03 DIAGNOSIS — R21 Rash and other nonspecific skin eruption: Secondary | ICD-10-CM | POA: Diagnosis not present

## 2021-08-03 DIAGNOSIS — U071 COVID-19: Secondary | ICD-10-CM | POA: Diagnosis not present

## 2021-08-03 DIAGNOSIS — I10 Essential (primary) hypertension: Secondary | ICD-10-CM | POA: Diagnosis not present

## 2021-08-03 DIAGNOSIS — R58 Hemorrhage, not elsewhere classified: Secondary | ICD-10-CM | POA: Diagnosis not present

## 2021-08-03 DIAGNOSIS — Z299 Encounter for prophylactic measures, unspecified: Secondary | ICD-10-CM | POA: Diagnosis not present

## 2021-08-30 DIAGNOSIS — I10 Essential (primary) hypertension: Secondary | ICD-10-CM | POA: Diagnosis not present

## 2021-09-03 DIAGNOSIS — I700 Atherosclerosis of aorta: Secondary | ICD-10-CM | POA: Diagnosis not present

## 2021-09-03 DIAGNOSIS — I10 Essential (primary) hypertension: Secondary | ICD-10-CM | POA: Diagnosis not present

## 2021-09-03 DIAGNOSIS — Z299 Encounter for prophylactic measures, unspecified: Secondary | ICD-10-CM | POA: Diagnosis not present

## 2021-09-03 DIAGNOSIS — N390 Urinary tract infection, site not specified: Secondary | ICD-10-CM | POA: Diagnosis not present

## 2021-09-03 DIAGNOSIS — R5383 Other fatigue: Secondary | ICD-10-CM | POA: Diagnosis not present

## 2021-09-29 DIAGNOSIS — I10 Essential (primary) hypertension: Secondary | ICD-10-CM | POA: Diagnosis not present

## 2021-10-29 DIAGNOSIS — I10 Essential (primary) hypertension: Secondary | ICD-10-CM | POA: Diagnosis not present

## 2021-11-28 DIAGNOSIS — I10 Essential (primary) hypertension: Secondary | ICD-10-CM | POA: Diagnosis not present

## 2021-11-30 ENCOUNTER — Encounter: Payer: Self-pay | Admitting: Internal Medicine

## 2021-12-10 DIAGNOSIS — I10 Essential (primary) hypertension: Secondary | ICD-10-CM | POA: Diagnosis not present

## 2021-12-10 DIAGNOSIS — E7800 Pure hypercholesterolemia, unspecified: Secondary | ICD-10-CM | POA: Diagnosis not present

## 2021-12-10 DIAGNOSIS — I700 Atherosclerosis of aorta: Secondary | ICD-10-CM | POA: Diagnosis not present

## 2021-12-10 DIAGNOSIS — Z299 Encounter for prophylactic measures, unspecified: Secondary | ICD-10-CM | POA: Diagnosis not present

## 2021-12-13 DIAGNOSIS — E7800 Pure hypercholesterolemia, unspecified: Secondary | ICD-10-CM | POA: Diagnosis not present

## 2021-12-28 DIAGNOSIS — I10 Essential (primary) hypertension: Secondary | ICD-10-CM | POA: Diagnosis not present

## 2022-01-27 DIAGNOSIS — I10 Essential (primary) hypertension: Secondary | ICD-10-CM | POA: Diagnosis not present

## 2022-01-27 DIAGNOSIS — E785 Hyperlipidemia, unspecified: Secondary | ICD-10-CM | POA: Diagnosis not present

## 2022-02-09 DIAGNOSIS — I10 Essential (primary) hypertension: Secondary | ICD-10-CM | POA: Diagnosis not present

## 2022-02-09 DIAGNOSIS — Z299 Encounter for prophylactic measures, unspecified: Secondary | ICD-10-CM | POA: Diagnosis not present

## 2022-02-09 DIAGNOSIS — Z7189 Other specified counseling: Secondary | ICD-10-CM | POA: Diagnosis not present

## 2022-02-09 DIAGNOSIS — Z0000 Encounter for general adult medical examination without abnormal findings: Secondary | ICD-10-CM | POA: Diagnosis not present

## 2022-02-09 DIAGNOSIS — Z789 Other specified health status: Secondary | ICD-10-CM | POA: Diagnosis not present

## 2022-02-27 DIAGNOSIS — E785 Hyperlipidemia, unspecified: Secondary | ICD-10-CM | POA: Diagnosis not present

## 2022-02-27 DIAGNOSIS — I10 Essential (primary) hypertension: Secondary | ICD-10-CM | POA: Diagnosis not present

## 2022-03-10 ENCOUNTER — Other Ambulatory Visit: Payer: Self-pay | Admitting: Internal Medicine

## 2022-03-10 DIAGNOSIS — Z1231 Encounter for screening mammogram for malignant neoplasm of breast: Secondary | ICD-10-CM

## 2022-03-23 ENCOUNTER — Ambulatory Visit: Payer: Medicare Other | Admitting: Gastroenterology

## 2022-03-23 ENCOUNTER — Encounter: Payer: Self-pay | Admitting: Gastroenterology

## 2022-03-23 VITALS — BP 120/64 | HR 65 | Temp 97.8°F | Ht 66.0 in | Wt 154.4 lb

## 2022-03-23 DIAGNOSIS — K219 Gastro-esophageal reflux disease without esophagitis: Secondary | ICD-10-CM | POA: Diagnosis not present

## 2022-03-23 NOTE — Progress Notes (Signed)
Gastroenterology Office Note     Primary Care Physician:  Ignatius Specking, MD  Primary Gastroenterologist: Dr. Jena Gauss   Chief Complaint   Chief Complaint  Patient presents with   Follow-up     History of Present Illness   Seleni Meller is a 66 y.o. female presenting today in follow-up with a history of GERD and routine screening colonoscopy due in 2024.   No abdominal pain, N/V, changes in bowel habits, constipation, diarrhea, overt GI bleeding, GERD, dysphagia, unexplained weight loss, lack of appetite, unexplained weight gain.   She states in 2024 she would rather do Cologuard and PCP is planning on this.   Getting refills on pantoprazole from PCP.   Past Medical History:  Diagnosis Date   DDD (degenerative disc disease), cervical     Past Surgical History:  Procedure Laterality Date   LUMBAR SPINE SURGERY     x 6   TUBAL LIGATION      Current Outpatient Medications  Medication Sig Dispense Refill   acetaminophen (TYLENOL) 500 MG tablet Take 500 mg by mouth as needed.     lisinopril (PRINIVIL,ZESTRIL) 10 MG tablet Take 10 mg by mouth daily.  4   pantoprazole (PROTONIX) 40 MG tablet Take 1 tablet by mouth as needed.      rosuvastatin (CRESTOR) 5 MG tablet Take 5 mg by mouth daily.     traMADol (ULTRAM) 50 MG tablet Take 50 mg by mouth as needed.     Calcium Carb-Cholecalciferol 1000-800 MG-UNIT TABS Take 1 tablet by mouth daily.  (Patient not taking: Reported on 03/23/2022)     No current facility-administered medications for this visit.    Allergies as of 03/23/2022 - Review Complete 03/23/2022  Allergen Reaction Noted   Bactrim [sulfamethoxazole-trimethoprim] Nausea And Vomiting 10/03/2018   Ciprofloxacin Nausea And Vomiting 01/24/2018   Hydrocodone Diarrhea and Other (See Comments) 01/24/2018   Nsaids Other (See Comments) 01/24/2018    Family History  Problem Relation Age of Onset   Colon cancer Neg Hx     Social History   Socioeconomic History    Marital status: Widowed    Spouse name: Not on file   Number of children: Not on file   Years of education: Not on file   Highest education level: Not on file  Occupational History   Not on file  Tobacco Use   Smoking status: Never   Smokeless tobacco: Never  Substance and Sexual Activity   Alcohol use: Never   Drug use: Never   Sexual activity: Not on file  Other Topics Concern   Not on file  Social History Narrative   Not on file   Social Determinants of Health   Financial Resource Strain: Not on file  Food Insecurity: Not on file  Transportation Needs: Not on file  Physical Activity: Not on file  Stress: Not on file  Social Connections: Not on file  Intimate Partner Violence: Not on file     Review of Systems   Gen: Denies any fever, chills, fatigue, weight loss, lack of appetite.  CV: Denies chest pain, heart palpitations, peripheral edema, syncope.  Resp: Denies shortness of breath at rest or with exertion. Denies wheezing or cough.  GI: Denies dysphagia or odynophagia. Denies jaundice, hematemesis, fecal incontinence. GU : Denies urinary burning, urinary frequency, urinary hesitancy MS: Denies joint pain, muscle weakness, cramps, or limitation of movement.  Derm: Denies rash, itching, dry skin Psych: Denies depression, anxiety, memory loss, and confusion Heme: Denies  bruising, bleeding, and enlarged lymph nodes.   Physical Exam   BP 120/64   Pulse 65   Temp 97.8 F (36.6 C)   Ht 5\' 6"  (1.676 m)   Wt 154 lb 6.4 oz (70 kg)   LMP 06/04/2010 (Approximate)   BMI 24.92 kg/m  General:   Alert and oriented. Pleasant and cooperative. Well-nourished and well-developed.  Head:  Normocephalic and atraumatic. Eyes:  Without icterus Abdomen:  +BS, soft, non-tender and non-distended. No HSM noted. No guarding or rebound. No masses appreciated.  Rectal:  Deferred  Msk:  Symmetrical without gross deformities. Normal posture. Extremities:  Without edema. Neurologic:   Alert and  oriented x4;  grossly normal neurologically. Skin:  Intact without significant lesions or rashes. Psych:  Alert and cooperative. Normal mood and affect.   Assessment   Redell Bhandari is a 66 y.o. female presenting today in follow-up with a history of chronic GERD for routine follow-up.  She has no concerning lower or upper GI signs/symptoms currently. She is receiving pantoprazole refills from PCP.   Colonoscopy due in 2024; however, she desires to do Cologuard and will be pursuing this through her PCP.  As she is doing well and not receiving refills through this practice, we can see her back prn.   PLAN    Continue PPI daily Further refills through PCP Return prn Recommend colonoscopy 2024 but patient declining and would rather pursue Cologuard through PCP.    2025, PhD, ANP-BC [facility]

## 2022-03-23 NOTE — Patient Instructions (Signed)
We will see you back as needed!   Please call if any issues with abdominal pain, nausea, vomiting, blood in stool.   It was a pleasure to see you today. I want to create trusting relationships with patients to provide genuine, compassionate, and quality care. I value your feedback. If you receive a survey regarding your visit,  I greatly appreciate you taking time to fill this out.   Gelene Mink, PhD, ANP-BC [facility]

## 2022-03-29 DIAGNOSIS — I10 Essential (primary) hypertension: Secondary | ICD-10-CM | POA: Diagnosis not present

## 2022-04-28 DIAGNOSIS — I10 Essential (primary) hypertension: Secondary | ICD-10-CM | POA: Diagnosis not present

## 2022-05-30 DIAGNOSIS — I10 Essential (primary) hypertension: Secondary | ICD-10-CM | POA: Diagnosis not present

## 2022-06-15 DIAGNOSIS — Z789 Other specified health status: Secondary | ICD-10-CM | POA: Diagnosis not present

## 2022-06-15 DIAGNOSIS — Z79899 Other long term (current) drug therapy: Secondary | ICD-10-CM | POA: Diagnosis not present

## 2022-06-15 DIAGNOSIS — I10 Essential (primary) hypertension: Secondary | ICD-10-CM | POA: Diagnosis not present

## 2022-06-15 DIAGNOSIS — Z299 Encounter for prophylactic measures, unspecified: Secondary | ICD-10-CM | POA: Diagnosis not present

## 2022-06-15 DIAGNOSIS — E7800 Pure hypercholesterolemia, unspecified: Secondary | ICD-10-CM | POA: Diagnosis not present

## 2022-06-15 DIAGNOSIS — I700 Atherosclerosis of aorta: Secondary | ICD-10-CM | POA: Diagnosis not present

## 2022-06-15 DIAGNOSIS — Z0000 Encounter for general adult medical examination without abnormal findings: Secondary | ICD-10-CM | POA: Diagnosis not present

## 2022-06-16 DIAGNOSIS — E7800 Pure hypercholesterolemia, unspecified: Secondary | ICD-10-CM | POA: Diagnosis not present

## 2022-06-16 DIAGNOSIS — Z0000 Encounter for general adult medical examination without abnormal findings: Secondary | ICD-10-CM | POA: Diagnosis not present

## 2022-06-16 DIAGNOSIS — Z79899 Other long term (current) drug therapy: Secondary | ICD-10-CM | POA: Diagnosis not present

## 2022-06-29 DIAGNOSIS — I10 Essential (primary) hypertension: Secondary | ICD-10-CM | POA: Diagnosis not present

## 2022-07-29 DIAGNOSIS — I10 Essential (primary) hypertension: Secondary | ICD-10-CM | POA: Diagnosis not present

## 2022-08-22 DIAGNOSIS — H2513 Age-related nuclear cataract, bilateral: Secondary | ICD-10-CM | POA: Diagnosis not present

## 2022-10-28 DIAGNOSIS — I10 Essential (primary) hypertension: Secondary | ICD-10-CM | POA: Diagnosis not present

## 2022-11-28 DIAGNOSIS — I10 Essential (primary) hypertension: Secondary | ICD-10-CM | POA: Diagnosis not present

## 2022-12-28 DIAGNOSIS — I10 Essential (primary) hypertension: Secondary | ICD-10-CM | POA: Diagnosis not present

## 2023-01-28 DIAGNOSIS — I10 Essential (primary) hypertension: Secondary | ICD-10-CM | POA: Diagnosis not present

## 2023-02-27 DIAGNOSIS — Z0000 Encounter for general adult medical examination without abnormal findings: Secondary | ICD-10-CM | POA: Diagnosis not present

## 2023-02-27 DIAGNOSIS — I700 Atherosclerosis of aorta: Secondary | ICD-10-CM | POA: Diagnosis not present

## 2023-02-27 DIAGNOSIS — Z7189 Other specified counseling: Secondary | ICD-10-CM | POA: Diagnosis not present

## 2023-02-27 DIAGNOSIS — M545 Low back pain, unspecified: Secondary | ICD-10-CM | POA: Diagnosis not present

## 2023-02-27 DIAGNOSIS — E7800 Pure hypercholesterolemia, unspecified: Secondary | ICD-10-CM | POA: Diagnosis not present

## 2023-02-27 DIAGNOSIS — Z1339 Encounter for screening examination for other mental health and behavioral disorders: Secondary | ICD-10-CM | POA: Diagnosis not present

## 2023-02-27 DIAGNOSIS — Z1331 Encounter for screening for depression: Secondary | ICD-10-CM | POA: Diagnosis not present

## 2023-02-27 DIAGNOSIS — Z299 Encounter for prophylactic measures, unspecified: Secondary | ICD-10-CM | POA: Diagnosis not present

## 2023-02-27 DIAGNOSIS — I10 Essential (primary) hypertension: Secondary | ICD-10-CM | POA: Diagnosis not present

## 2023-02-28 DIAGNOSIS — I10 Essential (primary) hypertension: Secondary | ICD-10-CM | POA: Diagnosis not present

## 2023-03-31 DIAGNOSIS — I10 Essential (primary) hypertension: Secondary | ICD-10-CM | POA: Diagnosis not present

## 2023-04-30 DIAGNOSIS — I10 Essential (primary) hypertension: Secondary | ICD-10-CM | POA: Diagnosis not present

## 2023-05-31 DIAGNOSIS — I10 Essential (primary) hypertension: Secondary | ICD-10-CM | POA: Diagnosis not present

## 2023-06-13 ENCOUNTER — Encounter: Payer: Self-pay | Admitting: *Deleted

## 2023-06-28 DIAGNOSIS — Z79899 Other long term (current) drug therapy: Secondary | ICD-10-CM | POA: Diagnosis not present

## 2023-06-28 DIAGNOSIS — Z0000 Encounter for general adult medical examination without abnormal findings: Secondary | ICD-10-CM | POA: Diagnosis not present

## 2023-06-28 DIAGNOSIS — R5383 Other fatigue: Secondary | ICD-10-CM | POA: Diagnosis not present

## 2023-06-28 DIAGNOSIS — Z299 Encounter for prophylactic measures, unspecified: Secondary | ICD-10-CM | POA: Diagnosis not present

## 2023-06-28 DIAGNOSIS — E7800 Pure hypercholesterolemia, unspecified: Secondary | ICD-10-CM | POA: Diagnosis not present

## 2023-07-01 DIAGNOSIS — I10 Essential (primary) hypertension: Secondary | ICD-10-CM | POA: Diagnosis not present

## 2023-07-31 DIAGNOSIS — I10 Essential (primary) hypertension: Secondary | ICD-10-CM | POA: Diagnosis not present

## 2023-08-30 DIAGNOSIS — I10 Essential (primary) hypertension: Secondary | ICD-10-CM | POA: Diagnosis not present

## 2023-09-25 DIAGNOSIS — R197 Diarrhea, unspecified: Secondary | ICD-10-CM | POA: Diagnosis not present

## 2023-09-29 DIAGNOSIS — I10 Essential (primary) hypertension: Secondary | ICD-10-CM | POA: Diagnosis not present

## 2023-10-30 DIAGNOSIS — I10 Essential (primary) hypertension: Secondary | ICD-10-CM | POA: Diagnosis not present

## 2024-06-03 ENCOUNTER — Other Ambulatory Visit: Payer: Self-pay | Admitting: Internal Medicine

## 2024-06-03 DIAGNOSIS — Z1231 Encounter for screening mammogram for malignant neoplasm of breast: Secondary | ICD-10-CM

## 2024-06-04 ENCOUNTER — Encounter
